# Patient Record
Sex: Male | Born: 1955 | Race: White | Hispanic: No | Marital: Married | Smoking: Former smoker
Health system: Southern US, Community
[De-identification: ages and names within clinical notes are randomized; demographics above are authoritative.]

## PROBLEM LIST (undated history)

## (undated) DIAGNOSIS — M1990 Unspecified osteoarthritis, unspecified site: Secondary | ICD-10-CM

## (undated) DIAGNOSIS — M109 Gout, unspecified: Secondary | ICD-10-CM

## (undated) HISTORY — PX: VASECTOMY: SHX75

---

## 2000-01-11 ENCOUNTER — Ambulatory Visit (HOSPITAL_COMMUNITY): Admission: RE | Admit: 2000-01-11 | Discharge: 2000-01-11 | Payer: Self-pay | Admitting: Gastroenterology

## 2000-01-26 ENCOUNTER — Encounter: Admission: RE | Admit: 2000-01-26 | Discharge: 2000-01-26 | Payer: Self-pay | Admitting: Gastroenterology

## 2000-01-26 ENCOUNTER — Encounter: Payer: Self-pay | Admitting: Gastroenterology

## 2009-05-03 HISTORY — PX: ANKLE SURGERY: SHX546

## 2012-12-09 HISTORY — PX: COLONOSCOPY: SHX174

## 2015-03-21 HISTORY — PX: KNEE ARTHROSCOPY: SUR90

## 2016-03-20 HISTORY — PX: KNEE ARTHROSCOPY: SUR90

## 2017-03-20 HISTORY — PX: COLONOSCOPY: SHX174

## 2017-11-14 ENCOUNTER — Encounter: Payer: Self-pay | Admitting: Gastroenterology

## 2018-04-26 HISTORY — PX: UMBILICAL HERNIA REPAIR: SHX196

## 2019-02-18 ENCOUNTER — Encounter: Payer: Self-pay | Admitting: Orthopaedic Surgery

## 2019-02-18 ENCOUNTER — Ambulatory Visit (INDEPENDENT_AMBULATORY_CARE_PROVIDER_SITE_OTHER): Payer: No Typology Code available for payment source | Admitting: Orthopaedic Surgery

## 2019-02-18 ENCOUNTER — Other Ambulatory Visit: Payer: Self-pay

## 2019-02-18 DIAGNOSIS — M1712 Unilateral primary osteoarthritis, left knee: Secondary | ICD-10-CM | POA: Diagnosis not present

## 2019-02-18 NOTE — Progress Notes (Signed)
Office Visit Note   Patient: Patrick Bright           Date of Birth: 1955/08/01           MRN: [id number] Visit Date: 02/18/2019              Requested by: No referring provider defined for this encounter. PCP: Patient, No Pcp Per   Assessment & Plan: Visit Diagnoses:  1. Unilateral primary osteoarthritis, left knee     Plan: Patient states he is ready proceed with left total knee arthroplasty.  Anti-inflammatories, knee sleeves, intra-articular injections with cortisone as well as Visco supplements have been ineffective.  He has had progressive changes and now has bone-on-bone changes.  Plan total knee arthroplasty with spinal anesthesia, Exparel and Marcaine postoperatively.  We discussed home therapy for couple weeks and then outpatient therapy.  He has a [place] address.  Procedure discussed questions were elicited and answered I talked with his wife by phone due to Covid restrictions she cannot be in the room.  He understands request to proceed.  Follow-Up Instructions: No follow-ups on file.   Orders:  No orders of the defined types were placed in this encounter.  No orders of the defined types were placed in this encounter.     Procedures: No procedures performed   Clinical Data: No additional findings.   Subjective: Chief Complaint  Patient presents with  . Left Knee - Pain    HPI 63 year old male here with chronic left knee pain authorized for referral from the [place] for surgical treatment.  He has had previous cortisone injections anti-inflammatories currently on diclofenac.  Previous arthroscopy right knee 3 years ago left knee 2 years ago by Dr. Junious Bright in [place].  He has had continued pain and intermittent swelling in his knee pain that wakes him up at night and has had Visco injections as well as cortisone injections without relief.  CD is brought with patient today which shows tricompartmental degenerative changes bone-on-bone changes medial compartment  flattening of the femoral condyle marginal osteophytes without marginal erosion.  Subchondral sclerosis and cyst formation is noted.  Review of Systems patient is an Scientist, research (life sciences).  Positive history of gout controlled on allopurinol 200 mg daily.  He takes does well at night to sleep and currently on Voltaren for his knee osteoarthritis left greater than right knee.  Umbilical hernia repair 08/19/7033 doing well.  Patient former smoker.  Negative for fever chills negative for M,I CVA, negative GI GU review of systems.   Objective: Vital Signs: BP 139/85   Pulse 82   Ht 5\' 11"  (1.803 m)   Wt 225 lb (102.1 kg)   BMI 31.38 kg/m   Physical Exam Constitutional:      Appearance: He is well-developed.  HENT:     Head: Normocephalic and atraumatic.  Eyes:     Pupils: Pupils are equal, round, and reactive to light.  Neck:     Thyroid: No thyromegaly.     Trachea: No tracheal deviation.  Cardiovascular:     Rate and Rhythm: Normal rate.  Pulmonary:     Effort: Pulmonary effort is normal.     Breath sounds: No wheezing.  Abdominal:     General: Bowel sounds are normal.     Palpations: Abdomen is soft.  Skin:    General: Skin is warm and dry.     Capillary Refill: Capillary refill takes less than 2 seconds.  Neurological:     Mental Status: He is  alert and oriented to person, place, and time.  Psychiatric:        Behavior: Behavior normal.        Thought Content: Thought content normal.        Judgment: Judgment normal.     Ortho Exam left knee has crepitus with range of motion mild varus deformity palpable medial osteophytes that are tender.  Pes bursa is normal no palpable Baker's cyst he flexes 110 degrees.  He is amatory with a left knee limp.  Negative logroll to the hips.  Distal pulses are 2+.  Specialty Comments:  No specialty comments available.  Imaging: X-rays brought by disc obtained at the [facility] shows moderate to severe left knee osteoarthritis  tricompartmental with bone-on-bone changes medial compartment.  See description above.  PMFS History: Patient Active Problem List   Diagnosis Date Noted  . Unilateral primary osteoarthritis, left knee 02/18/2019   No past medical history on file.  Family History  Problem Relation Age of Onset  . Cancer Father     Past Surgical History:  Procedure Laterality Date  . KNEE ARTHROSCOPY Right 2017  . KNEE ARTHROSCOPY Left 2018  . UMBILICAL HERNIA REPAIR  04/26/2018   Social History   Occupational History  . Not on file  Tobacco Use  . Smoking status: Former Games developer  . Smokeless tobacco: Never Used  . Tobacco comment: quit 10/2018  Substance and Sexual Activity  . Alcohol use: Yes    Comment: occasional  . Drug use: Not on file  . Sexual activity: Not on file

## 2019-02-18 NOTE — H&P (Signed)
TOTAL KNEE ADMISSION H&P  Patient is being admitted for left total knee arthroplasty.  Subjective:  Chief Complaint:left knee pain.  HPI: Patrick Bright, 63 y.o. male, has a history of pain and functional disability in the left knee due to arthritis and has failed non-surgical conservative treatments for greater than 12 weeks to includeNSAID's and/or analgesics, corticosteriod injections, viscosupplementation injections, use of assistive devices and activity modification.  Onset of symptoms was gradual, starting 10 years ago with gradually worsening course since that time.  Patient currently rates pain in the left knee(s) at 10 out of 10 with activity. Patient has night pain, worsening of pain with activity and weight bearing, pain that interferes with activities of daily living, pain with passive range of motion, crepitus and joint swelling.  Patient has evidence of subchondral sclerosis, periarticular osteophytes and joint space narrowing by imaging studies.  There is no active infection.  There are no active problems to display for this patient.  No past medical history on file.  Past Surgical History:  Procedure Laterality Date  . KNEE ARTHROSCOPY Right 2017  . KNEE ARTHROSCOPY Left 2018  . UMBILICAL HERNIA REPAIR  04/26/2018    No current facility-administered medications for this encounter.    Current Outpatient Medications  Medication Sig Dispense Refill Last Dose  . allopurinol (ZYLOPRIM) 100 MG tablet Take 100 mg by mouth 2 (two) times daily.   Taking  . diclofenac (VOLTAREN) 75 MG EC tablet Take 75 mg by mouth daily.   Taking  . Multiple Vitamins-Minerals (ONE-A-DAY 50 PLUS PO) Take by mouth.   Taking  . Omega-3 Fatty Acids (FISH OIL PO) Take 1,400 mg by mouth daily.   Taking  . traZODone (DESYREL) 50 MG tablet Take 50 mg by mouth at bedtime.   Taking   No Known Allergies  Social History   Tobacco Use  . Smoking status: Former Games developer  . Smokeless tobacco: Never Used  .  Tobacco comment: quit 10/2018  Substance Use Topics  . Alcohol use: Yes    Comment: occasional    Family History  Problem Relation Age of Onset  . Cancer Father      Review of Systems  Constitutional: Negative.   HENT: Negative.   Cardiovascular: Negative.   Gastrointestinal: Negative.   Musculoskeletal: Positive for joint pain.  Neurological: Negative.   Psychiatric/Behavioral: Negative.     Objective:  Physical Exam  Constitutional: He is oriented to person, place, and time. He appears well-developed and well-nourished. No distress.  HENT:  Head: Normocephalic and atraumatic.  Eyes: Pupils are equal, round, and reactive to light. EOM are normal.  Neck: Normal range of motion.  Cardiovascular: Normal rate and normal heart sounds.  Respiratory: Effort normal and breath sounds normal. No respiratory distress. He has no wheezes.  GI: Soft. Bowel sounds are normal. He exhibits no distension. There is no abdominal tenderness.  Musculoskeletal:        General: Tenderness present.  Neurological: He is alert and oriented to person, place, and time.  Skin: Skin is warm and dry.  Psychiatric: He has a normal mood and affect.    Vital signs in last 24 hours: Pulse Rate:  [82] 82 (12/01 1326) BP: (139)/(85) 139/85 (12/01 1326) Weight:  [102.1 kg] 102.1 kg (12/01 1326)  Labs:   Estimated body mass index is 31.38 kg/m as calculated from the following:   Height as of 02/18/19: 5\' 11"  (1.803 m).   Weight as of 02/18/19: 102.1 kg.   Imaging Review  Plain radiographs demonstrate moderate degenerative joint disease of the left knee(s). The overall alignment ismild varus. The bone quality appears to be good for age and reported activity level.      Assessment/Plan:  End stage arthritis, left knee   The patient history, physical examination, clinical judgment of the provider and imaging studies are consistent with end stage degenerative joint disease of the left knee(s) and  total knee arthroplasty is deemed medically necessary. The treatment options including medical management, injection therapy arthroscopy and arthroplasty were discussed at length. The risks and benefits of total knee arthroplasty were presented and reviewed. The risks due to aseptic loosening, infection, stiffness, patella tracking problems, thromboembolic complications and other imponderables were discussed. The patient acknowledged the explanation, agreed to proceed with the plan and consent was signed. Patient is being admitted for inpatient treatment for surgery, pain control, PT, OT, prophylactic antibiotics, VTE prophylaxis, progressive ambulation and ADL's and discharge planning. The patient is planning to be discharged home with home health services    Anticipated LOS equal to or greater than 2 midnights due to - Age 58 and older with one or more of the following:  - Obesity  - Expected need for hospital services (PT, OT, Nursing) required for safe  discharge  - Anticipated need for postoperative skilled nursing care or inpatient rehab  - Active co-morbidities: None OR   - Unanticipated findings during/Post Surgery: None  - Patient is a high risk of re-admission due to: None

## 2019-02-19 ENCOUNTER — Telehealth: Payer: Self-pay | Admitting: Orthopaedic Surgery

## 2019-02-19 ENCOUNTER — Other Ambulatory Visit: Payer: Self-pay

## 2019-02-19 NOTE — Telephone Encounter (Signed)
faxed

## 2019-02-19 NOTE — Telephone Encounter (Signed)
Received call from [place] from [facility] needing office visit notes faxed to her for DOS 02/18/2019   The fax# is [phone]   The ph# is [phone]

## 2019-03-03 NOTE — Pre-Procedure Instructions (Signed)
Patrick Bright  03/03/2019      [facility] - [place], [place] - [address] [place] [address] Phone: [phone] Fax: [phone]  [facility] [address] [place] - [address] [facility] [place] [address] Phone: [phone] Fax: [phone]    Your procedure is scheduled on March 07, 2019.  Report to [facility] Entrance "A" at 8:25 AM.  Call this number if you have problems the morning of surgery:  [phone]  Call [phone] if you have any questions prior to your surgery date Monday-Friday 8am-4pm    Remember:  Do not eat after midnight.   You may drink clear liquids until 7:25 AM.  Clear liquids allowed are:                    Water, Juice (non-citric and without pulp), Clear Tea, Black Coffee only and Gatorade- DO NOT ADD MILK PRODUCTS OR SUGAR  Please complete your PRE-SURGERY ENSURE that was provided to you by 7:25 AM the morning of surgery.  Please, if able, drink it in one setting. DO NOT SIP.    Take these medicines the morning of surgery with A SIP OF WATER Allopurinol (Zyloprim)   Beginning now, STOP taking any Diclofenac (Voltaren), Aspirin (unless otherwise instructed by your surgeon), Aleve, Naproxen, Ibuprofen, Motrin, Advil, Goody's, BC's, all herbal medications, fish oil, and all vitamins  Day of Surgery:   Do not wear jewelry  Do not wear lotions, powders, or colognes, or deodorant.  Men may shave face and neck.  Do not bring valuables to the hospital.  [facility] is not responsible for any belongings or valuables.  Contacts, dentures or bridgework may not be worn into surgery.  Leave your suitcase in the car.  After surgery it may be brought to your room.  For patients admitted to the hospital, discharge time will be determined by your treatment team.  Patients discharged the day of surgery will not be allowed to drive home.   [facility]- Preparing For Surgery  Before surgery, you can play an important role. Because skin is not sterile, your skin needs to be as free of germs as possible. You can reduce the number of germs on your skin by washing with CHG (chlorahexidine gluconate) Soap before surgery.  CHG is an antiseptic cleaner which kills germs and bonds with the skin to continue killing germs even after washing.    Oral Hygiene is also important to reduce your risk of infection.  Remember - BRUSH YOUR TEETH THE MORNING OF SURGERY WITH YOUR REGULAR TOOTHPASTE  Please do not use if you have an allergy to CHG or antibacterial soaps. If your skin becomes reddened/irritated stop using the CHG.  Do not shave (including legs and underarms) for at least 48 hours prior to first CHG shower. It is OK to shave your face.  Please follow these instructions carefully.   1. Shower the NIGHT BEFORE SURGERY and the MORNING OF SURGERY with CHG.   2. If you chose to wash your hair, wash your hair first as usual with your normal shampoo.  3. After you shampoo, rinse your hair and body thoroughly to remove the shampoo.  4. Use CHG as you would any other liquid soap. You can apply CHG directly to the skin and wash gently with a scrungie or a clean washcloth.   5. Apply the CHG Soap to your body ONLY FROM  THE NECK DOWN.  Do not use on open wounds or open sores. Avoid contact with your eyes, ears, mouth and genitals (private parts). Wash Face and genitals (private parts)  with your normal soap.  6. Wash thoroughly, paying special attention to the area where your surgery will be performed.  7. Thoroughly rinse your body with warm water from the neck down.  8. DO NOT shower/wash with your normal soap after using and rinsing off the CHG Soap.  9. Pat yourself dry with a CLEAN TOWEL.  10. Wear CLEAN PAJAMAS to bed the night before surgery, wear comfortable clothes the morning of surgery  11. Place CLEAN SHEETS on your bed the night of  your first shower and DO NOT SLEEP WITH PETS.  Day of Surgery: Shower with CHG as instructed above Do not apply any deodorants/lotions.  Please wear clean clothes to the hospital/surgery center.   Remember to brush your teeth WITH YOUR REGULAR TOOTHPASTE.  Please read over the following fact sheets that you were given.

## 2019-03-04 ENCOUNTER — Encounter (HOSPITAL_COMMUNITY)
Admission: RE | Admit: 2019-03-04 | Discharge: 2019-03-04 | Disposition: A | Discharge status: Home / Self Care | Payer: No Typology Code available for payment source | Source: Ambulatory Visit | Attending: Orthopaedic Surgery | Admitting: Orthopaedic Surgery

## 2019-03-04 ENCOUNTER — Other Ambulatory Visit (HOSPITAL_COMMUNITY)
Admission: RE | Admit: 2019-03-04 | Discharge: 2019-03-04 | Disposition: A | Discharge status: Home / Self Care | Payer: No Typology Code available for payment source | Source: Ambulatory Visit | Attending: Orthopaedic Surgery | Admitting: Orthopaedic Surgery

## 2019-03-04 ENCOUNTER — Ambulatory Visit (HOSPITAL_COMMUNITY)
Admission: RE | Admit: 2019-03-04 | Discharge: 2019-03-04 | Disposition: A | Discharge status: Home / Self Care | Payer: No Typology Code available for payment source | Source: Ambulatory Visit | Attending: Surgery | Admitting: Surgery

## 2019-03-04 ENCOUNTER — Other Ambulatory Visit: Payer: Self-pay

## 2019-03-04 ENCOUNTER — Encounter (HOSPITAL_COMMUNITY): Payer: Self-pay

## 2019-03-04 DIAGNOSIS — Z01818 Encounter for other preprocedural examination: Secondary | ICD-10-CM

## 2019-03-04 DIAGNOSIS — M1712 Unilateral primary osteoarthritis, left knee: Secondary | ICD-10-CM | POA: Insufficient documentation

## 2019-03-04 DIAGNOSIS — Z20828 Contact with and (suspected) exposure to other viral communicable diseases: Secondary | ICD-10-CM | POA: Insufficient documentation

## 2019-03-04 HISTORY — DX: Gout, unspecified: M10.9

## 2019-03-04 HISTORY — DX: Unspecified osteoarthritis, unspecified site: M19.90

## 2019-03-04 LAB — CBC
HCT: 47 % (ref 39.0–52.0)
Hemoglobin: 15.7 g/dL (ref 13.0–17.0)
MCH: 32.1 pg (ref 26.0–34.0)
MCHC: 33.4 g/dL (ref 30.0–36.0)
MCV: 96.1 fL (ref 80.0–100.0)
Platelets: 271 10*3/uL (ref 150–400)
RBC: 4.89 MIL/uL (ref 4.22–5.81)
RDW: 12.8 % (ref 11.5–15.5)
WBC: 7.9 10*3/uL (ref 4.0–10.5)
nRBC: 0 % (ref 0.0–0.2)

## 2019-03-04 LAB — SURGICAL PCR SCREEN
MRSA, PCR: NEGATIVE
Staphylococcus aureus: NEGATIVE

## 2019-03-04 LAB — URINALYSIS, ROUTINE W REFLEX MICROSCOPIC
Bilirubin Urine: NEGATIVE
Glucose, UA: NEGATIVE mg/dL
Hgb urine dipstick: NEGATIVE
Ketones, ur: NEGATIVE mg/dL
Leukocytes,Ua: NEGATIVE
Nitrite: NEGATIVE
Protein, ur: NEGATIVE mg/dL
Specific Gravity, Urine: 1.024 (ref 1.005–1.030)
pH: 5 (ref 5.0–8.0)

## 2019-03-04 LAB — COMPREHENSIVE METABOLIC PANEL
ALT: 62 U/L — ABNORMAL HIGH (ref 0–44)
AST: 45 U/L — ABNORMAL HIGH (ref 15–41)
Albumin: 4 g/dL (ref 3.5–5.0)
Alkaline Phosphatase: 57 U/L (ref 38–126)
Anion gap: 17 — ABNORMAL HIGH (ref 5–15)
BUN: 12 mg/dL (ref 8–23)
CO2: 19 mmol/L — ABNORMAL LOW (ref 22–32)
Calcium: 9.6 mg/dL (ref 8.9–10.3)
Chloride: 103 mmol/L (ref 98–111)
Creatinine, Ser: 0.98 mg/dL (ref 0.61–1.24)
GFR calc Af Amer: >60 mL/min (ref >60)
GFR calc non Af Amer: >60 mL/min (ref >60)
Glucose, Bld: 95 mg/dL (ref 70–99)
Potassium: 4.2 mmol/L (ref 3.5–5.1)
Sodium: 139 mmol/L (ref 135–145)
Total Bilirubin: 0.7 mg/dL (ref 0.3–1.2)
Total Protein: 7.5 g/dL (ref 6.5–8.1)

## 2019-03-04 NOTE — Progress Notes (Signed)
[facility] Cardiologist - denies  PPM/ICD - N/A Device Orders -N/A  Rep Notified - N/A  Chest x-ray - 03/04/19 EKG - 03/04/19 Stress Test - denies ECHO - denies Cardiac Cath - denies  Sleep Study - denies CPAP - denies  Blood Thinner Instructions:N/A Aspirin Instructions:N/A  ERAS Protcol -Yes PRE-SURGERY Ensure or G2- Ensure provided.   COVID TEST- 03/04/19.    Anesthesia review: No  Patient denies shortness of breath, fever, cough and chest pain at PAT appointment   All instructions explained to the patient, with a verbal understanding of the material. Patient agrees to go over the instructions while at home for a better understanding. Patient also instructed to self quarantine after being tested for COVID-19. The opportunity to ask questions was provided.   Coronavirus Screening  Have you experienced the following symptoms:  Cough yes/no: No Fever (>100.27F)  yes/no: No Runny nose yes/no: No Sore throat yes/no: No Difficulty breathing/shortness of breath  yes/no: No  Have you or a family member traveled in the last 14 days and where? yes/no: No   If the patient indicates "YES" to the above questions, their PAT will be rescheduled to limit the exposure to others and, the surgeon will be notified. THE PATIENT WILL NEED TO BE ASYMPTOMATIC FOR 14 DAYS.   If the patient is not experiencing any of these symptoms, the PAT nurse will instruct them to NOT bring anyone with them to their appointment since they may have these symptoms or traveled as well.   Please remind your patients and families that hospital visitation restrictions are in effect and the importance of the restrictions.

## 2019-03-05 LAB — NOVEL CORONAVIRUS, NAA (HOSP ORDER, SEND-OUT TO REF LAB; TAT 18-24 HRS): SARS-CoV-2, NAA: NOT DETECTED

## 2019-03-06 MED ORDER — TRANEXAMIC ACID-NACL 1000-0.7 MG/100ML-% IV SOLN
1000.0000 mg | INTRAVENOUS | Status: AC
  Administered 2019-03-07: 1000 mg via INTRAVENOUS
  Filled 2019-03-06: qty 100

## 2019-03-06 MED ORDER — BUPIVACAINE LIPOSOME 1.3 % IJ SUSP
20.0000 mL | Freq: Once | INTRAMUSCULAR | Status: AC
  Administered 2019-03-07: 20 mL
  Filled 2019-03-06: qty 20

## 2019-03-07 ENCOUNTER — Inpatient Hospital Stay (HOSPITAL_COMMUNITY): Payer: No Typology Code available for payment source | Admitting: Anesthesiology

## 2019-03-07 ENCOUNTER — Encounter (HOSPITAL_COMMUNITY)
Admission: RE | Disposition: A | Discharge status: Home / Self Care | Payer: Self-pay | Source: Home / Self Care | Attending: Orthopaedic Surgery

## 2019-03-07 ENCOUNTER — Inpatient Hospital Stay (HOSPITAL_COMMUNITY)
Admission: RE | Admit: 2019-03-07 | Discharge: 2019-03-08 | DRG: 470 | Disposition: A | Discharge status: Home Health | Payer: No Typology Code available for payment source | Attending: Orthopaedic Surgery | Admitting: Orthopaedic Surgery

## 2019-03-07 ENCOUNTER — Observation Stay (HOSPITAL_COMMUNITY): Payer: No Typology Code available for payment source

## 2019-03-07 ENCOUNTER — Other Ambulatory Visit: Payer: Self-pay

## 2019-03-07 ENCOUNTER — Encounter (HOSPITAL_COMMUNITY): Payer: Self-pay | Admitting: Orthopaedic Surgery

## 2019-03-07 DIAGNOSIS — Z791 Long term (current) use of non-steroidal anti-inflammatories (NSAID): Secondary | ICD-10-CM | POA: Diagnosis not present

## 2019-03-07 DIAGNOSIS — Z79899 Other long term (current) drug therapy: Secondary | ICD-10-CM | POA: Diagnosis not present

## 2019-03-07 DIAGNOSIS — E669 Obesity, unspecified: Secondary | ICD-10-CM | POA: Diagnosis present

## 2019-03-07 DIAGNOSIS — Z6831 Body mass index (BMI) 31.0-31.9, adult: Secondary | ICD-10-CM | POA: Diagnosis not present

## 2019-03-07 DIAGNOSIS — Z87891 Personal history of nicotine dependence: Secondary | ICD-10-CM

## 2019-03-07 DIAGNOSIS — M1712 Unilateral primary osteoarthritis, left knee: Secondary | ICD-10-CM | POA: Diagnosis present

## 2019-03-07 DIAGNOSIS — Z20828 Contact with and (suspected) exposure to other viral communicable diseases: Secondary | ICD-10-CM | POA: Diagnosis present

## 2019-03-07 DIAGNOSIS — Z09 Encounter for follow-up examination after completed treatment for conditions other than malignant neoplasm: Secondary | ICD-10-CM

## 2019-03-07 HISTORY — PX: TOTAL KNEE ARTHROPLASTY: SHX125

## 2019-03-07 SURGERY — ARTHROPLASTY, KNEE, TOTAL
Anesthesia: Spinal | Site: Knee | Laterality: Left

## 2019-03-07 MED ORDER — DOCUSATE SODIUM 100 MG PO CAPS
100.0000 mg | ORAL_CAPSULE | Freq: Two times a day (BID) | ORAL | Status: DC
  Administered 2019-03-07 – 2019-03-08 (×3): 100 mg via ORAL
  Filled 2019-03-07 (×3): qty 1

## 2019-03-07 MED ORDER — FENTANYL CITRATE (PF) 100 MCG/2ML IJ SOLN
INTRAMUSCULAR | Status: AC
  Filled 2019-03-07: qty 2

## 2019-03-07 MED ORDER — OXYCODONE HCL 5 MG/5ML PO SOLN: 5.0000 mg | Freq: Once | ORAL | Status: AC | PRN

## 2019-03-07 MED ORDER — ONDANSETRON HCL 4 MG/2ML IJ SOLN: 4.0000 mg | Freq: Four times a day (QID) | INTRAMUSCULAR | Status: DC | PRN

## 2019-03-07 MED ORDER — FENTANYL CITRATE (PF) 250 MCG/5ML IJ SOLN
INTRAMUSCULAR | Status: AC
  Filled 2019-03-07: qty 5

## 2019-03-07 MED ORDER — METOCLOPRAMIDE HCL 5 MG/ML IJ SOLN: 5.0000 mg | Freq: Three times a day (TID) | INTRAMUSCULAR | Status: DC | PRN

## 2019-03-07 MED ORDER — TRAZODONE HCL 50 MG PO TABS: 50.0000 mg | ORAL_TABLET | Freq: Every evening | ORAL | Status: DC | PRN

## 2019-03-07 MED ORDER — PHENOL 1.4 % MT LIQD: 1.0000 | OROMUCOSAL | Status: DC | PRN

## 2019-03-07 MED ORDER — PROPOFOL 500 MG/50ML IV EMUL
INTRAVENOUS | Status: DC | PRN
  Administered 2019-03-07 (×2): 100 ug/kg/min via INTRAVENOUS

## 2019-03-07 MED ORDER — OXYCODONE HCL 5 MG PO TABS
5.0000 mg | ORAL_TABLET | Freq: Once | ORAL | Status: AC | PRN
  Administered 2019-03-07: 5 mg via ORAL

## 2019-03-07 MED ORDER — POLYETHYLENE GLYCOL 3350 17 G PO PACK: 17.0000 g | PACK | Freq: Every day | ORAL | Status: DC | PRN

## 2019-03-07 MED ORDER — MIDAZOLAM HCL 2 MG/2ML IJ SOLN: 2.0000 mg | Freq: Once | INTRAMUSCULAR | Status: AC

## 2019-03-07 MED ORDER — LACTATED RINGERS IV SOLN: INTRAVENOUS | Status: DC

## 2019-03-07 MED ORDER — PHENYLEPHRINE HCL-NACL 10-0.9 MG/250ML-% IV SOLN
INTRAVENOUS | Status: DC | PRN
  Administered 2019-03-07: 30 ug/min via INTRAVENOUS

## 2019-03-07 MED ORDER — CEFAZOLIN SODIUM-DEXTROSE 2-4 GM/100ML-% IV SOLN
2.0000 g | INTRAVENOUS | Status: AC
  Administered 2019-03-07: 10:00:00 2 g via INTRAVENOUS

## 2019-03-07 MED ORDER — FENTANYL CITRATE (PF) 100 MCG/2ML IJ SOLN
INTRAMUSCULAR | Status: AC
  Administered 2019-03-07: 50 ug via INTRAVENOUS
  Filled 2019-03-07: qty 2

## 2019-03-07 MED ORDER — FENTANYL CITRATE (PF) 100 MCG/2ML IJ SOLN
25.0000 ug | INTRAMUSCULAR | Status: DC | PRN
  Administered 2019-03-07: 50 ug via INTRAVENOUS

## 2019-03-07 MED ORDER — MENTHOL 3 MG MT LOZG: 1.0000 | LOZENGE | OROMUCOSAL | Status: DC | PRN

## 2019-03-07 MED ORDER — OXYCODONE HCL 5 MG PO TABS
5.0000 mg | ORAL_TABLET | ORAL | Status: DC | PRN
  Administered 2019-03-07 – 2019-03-08 (×5): 10 mg via ORAL
  Filled 2019-03-07 (×5): qty 2

## 2019-03-07 MED ORDER — PROPOFOL 10 MG/ML IV BOLUS
INTRAVENOUS | Status: AC
  Filled 2019-03-07: qty 40

## 2019-03-07 MED ORDER — METHOCARBAMOL 500 MG PO TABS: 500.0000 mg | ORAL_TABLET | Freq: Four times a day (QID) | ORAL | 0 refills | Status: DC | PRN

## 2019-03-07 MED ORDER — OXYCODONE HCL 5 MG PO TABS
ORAL_TABLET | ORAL | Status: AC
  Filled 2019-03-07: qty 1

## 2019-03-07 MED ORDER — MIDAZOLAM HCL 2 MG/2ML IJ SOLN
INTRAMUSCULAR | Status: AC
  Administered 2019-03-07: 2 mg via INTRAVENOUS
  Filled 2019-03-07: qty 2

## 2019-03-07 MED ORDER — ROPIVACAINE HCL 7.5 MG/ML IJ SOLN
INTRAMUSCULAR | Status: DC | PRN
  Administered 2019-03-07: 20 mL via PERINEURAL

## 2019-03-07 MED ORDER — ACETAMINOPHEN 325 MG PO TABS: 325.0000 mg | ORAL_TABLET | Freq: Four times a day (QID) | ORAL | Status: DC | PRN

## 2019-03-07 MED ORDER — CHLORHEXIDINE GLUCONATE 4 % EX LIQD: 60.0000 mL | Freq: Once | CUTANEOUS | Status: DC

## 2019-03-07 MED ORDER — SODIUM CHLORIDE 0.9 % IV SOLN: INTRAVENOUS | Status: DC

## 2019-03-07 MED ORDER — ALLOPURINOL 100 MG PO TABS
200.0000 mg | ORAL_TABLET | Freq: Every day | ORAL | Status: DC
  Administered 2019-03-07 – 2019-03-08 (×2): 200 mg via ORAL
  Filled 2019-03-07 (×2): qty 2

## 2019-03-07 MED ORDER — ASPIRIN EC 325 MG PO TBEC
325.0000 mg | DELAYED_RELEASE_TABLET | Freq: Every day | ORAL | Status: DC
  Administered 2019-03-08: 325 mg via ORAL
  Filled 2019-03-07: qty 1

## 2019-03-07 MED ORDER — OXYCODONE-ACETAMINOPHEN 7.5-325 MG PO TABS: 1.0000 | ORAL_TABLET | ORAL | 0 refills | Status: DC | PRN

## 2019-03-07 MED ORDER — METHOCARBAMOL 500 MG PO TABS
500.0000 mg | ORAL_TABLET | Freq: Four times a day (QID) | ORAL | Status: DC | PRN
  Administered 2019-03-07 – 2019-03-08 (×4): 500 mg via ORAL
  Filled 2019-03-07 (×4): qty 1

## 2019-03-07 MED ORDER — PROPOFOL 10 MG/ML IV BOLUS
INTRAVENOUS | Status: DC | PRN
  Administered 2019-03-07: 40 mg via INTRAVENOUS

## 2019-03-07 MED ORDER — SODIUM CHLORIDE 0.9 % IR SOLN
Status: DC | PRN
  Administered 2019-03-07: 3000 mL

## 2019-03-07 MED ORDER — METOCLOPRAMIDE HCL 5 MG PO TABS: 5.0000 mg | ORAL_TABLET | Freq: Three times a day (TID) | ORAL | Status: DC | PRN

## 2019-03-07 MED ORDER — 0.9 % SODIUM CHLORIDE (POUR BTL) OPTIME
TOPICAL | Status: DC | PRN
  Administered 2019-03-07: 1000 mL

## 2019-03-07 MED ORDER — NICOTINE POLACRILEX 2 MG MT GUM
2.0000 mg | CHEWING_GUM | OROMUCOSAL | Status: DC | PRN
  Filled 2019-03-07: qty 1

## 2019-03-07 MED ORDER — MIDAZOLAM HCL 2 MG/2ML IJ SOLN
INTRAMUSCULAR | Status: AC
  Filled 2019-03-07: qty 2

## 2019-03-07 MED ORDER — ASPIRIN EC 325 MG PO TBEC: 325.0000 mg | DELAYED_RELEASE_TABLET | Freq: Every day | ORAL | 0 refills | Status: DC

## 2019-03-07 MED ORDER — METHOCARBAMOL 500 MG PO TABS
ORAL_TABLET | ORAL | Status: AC
  Filled 2019-03-07: qty 1

## 2019-03-07 MED ORDER — BUPIVACAINE HCL (PF) 0.5 % IJ SOLN
INTRAMUSCULAR | Status: AC
  Filled 2019-03-07: qty 30

## 2019-03-07 MED ORDER — CEFAZOLIN SODIUM-DEXTROSE 2-4 GM/100ML-% IV SOLN
INTRAVENOUS | Status: AC
  Filled 2019-03-07: qty 100

## 2019-03-07 MED ORDER — HYDROMORPHONE HCL 1 MG/ML IJ SOLN
0.5000 mg | INTRAMUSCULAR | Status: DC | PRN
  Administered 2019-03-07: 1 mg via INTRAVENOUS
  Filled 2019-03-07: qty 1

## 2019-03-07 MED ORDER — ONDANSETRON HCL 4 MG PO TABS: 4.0000 mg | ORAL_TABLET | Freq: Four times a day (QID) | ORAL | Status: DC | PRN

## 2019-03-07 MED ORDER — BUPIVACAINE IN DEXTROSE 0.75-8.25 % IT SOLN
INTRATHECAL | Status: DC | PRN
  Administered 2019-03-07: 1.8 mL via INTRATHECAL

## 2019-03-07 MED ORDER — METHOCARBAMOL 1000 MG/10ML IJ SOLN
500.0000 mg | Freq: Four times a day (QID) | INTRAVENOUS | Status: DC | PRN
  Filled 2019-03-07: qty 5

## 2019-03-07 MED ORDER — BUPIVACAINE HCL (PF) 0.5 % IJ SOLN
INTRAMUSCULAR | Status: DC | PRN
  Administered 2019-03-07: 20 mL

## 2019-03-07 MED ORDER — PROMETHAZINE HCL 25 MG/ML IJ SOLN: 6.2500 mg | INTRAMUSCULAR | Status: DC | PRN

## 2019-03-07 MED ORDER — FENTANYL CITRATE (PF) 100 MCG/2ML IJ SOLN: 50.0000 ug | Freq: Once | INTRAMUSCULAR | Status: AC

## 2019-03-07 SURGICAL SUPPLY — 82 items
APL SKNCLS STERI-STRIP NONHPOA (GAUZE/BANDAGES/DRESSINGS) ×1
ATTUNE MED DOME PAT 41 KNEE (Knees) ×1 IMPLANT
ATTUNE MED DOME PAT 41MM KNEE (Knees) ×1 IMPLANT
ATTUNE PS FEM LT SZ 6 CEM KNEE (Femur) ×2 IMPLANT
ATTUNE PSRP INSR SZ6 5 KNEE (Insert) ×1 IMPLANT
ATTUNE PSRP INSR SZ6 5MM KNEE (Insert) ×1 IMPLANT
BANDAGE ESMARK 6X9 LF (GAUZE/BANDAGES/DRESSINGS) ×1 IMPLANT
BASE TIBIA ATTUNE KNEE SYS SZ6 (Knees) IMPLANT
BENZOIN TINCTURE PRP APPL 2/3 (GAUZE/BANDAGES/DRESSINGS) ×3 IMPLANT
BLADE SAGITTAL 25.0X1.19X90 (BLADE) ×2 IMPLANT
BLADE SAGITTAL 25.0X1.19X90MM (BLADE) ×1
BLADE SAW SGTL 13X75X1.27 (BLADE) ×3 IMPLANT
BNDG CMPR 9X6 STRL LF SNTH (GAUZE/BANDAGES/DRESSINGS) ×1
BNDG CMPR MED 10X6 ELC LF (GAUZE/BANDAGES/DRESSINGS) ×1
BNDG ELASTIC 4X5.8 VLCR STR LF (GAUZE/BANDAGES/DRESSINGS) ×3 IMPLANT
BNDG ELASTIC 6X10 VLCR STRL LF (GAUZE/BANDAGES/DRESSINGS) ×3 IMPLANT
BNDG ESMARK 6X9 LF (GAUZE/BANDAGES/DRESSINGS) ×3
BOWL SMART MIX CTS (DISPOSABLE) ×3 IMPLANT
BSPLAT TIB 6 CMNT ROT PLAT STR (Knees) ×1 IMPLANT
CEMENT HV SMART SET (Cement) ×6 IMPLANT
CLOSURE WOUND 1/2 X4 (GAUZE/BANDAGES/DRESSINGS) ×2
COVER SURGICAL LIGHT HANDLE (MISCELLANEOUS) ×3 IMPLANT
COVER WAND RF STERILE (DRAPES) ×3 IMPLANT
CUFF TOURN SGL QUICK 34 (TOURNIQUET CUFF) ×3
CUFF TOURN SGL QUICK 42 (TOURNIQUET CUFF) ×2 IMPLANT
CUFF TRNQT CYL 34X4.125X (TOURNIQUET CUFF) ×1 IMPLANT
DRAPE ORTHO SPLIT 77X108 STRL (DRAPES) ×6
DRAPE SURG ORHT 6 SPLT 77X108 (DRAPES) ×2 IMPLANT
DRAPE U-SHAPE 47X51 STRL (DRAPES) ×3 IMPLANT
DRSG PAD ABDOMINAL 8X10 ST (GAUZE/BANDAGES/DRESSINGS) ×3 IMPLANT
DURAPREP 26ML APPLICATOR (WOUND CARE) ×6 IMPLANT
ELECT REM PT RETURN 9FT ADLT (ELECTROSURGICAL) ×3
ELECTRODE REM PT RTRN 9FT ADLT (ELECTROSURGICAL) ×1 IMPLANT
EVACUATOR 1/8 PVC DRAIN (DRAIN) IMPLANT
FACESHIELD WRAPAROUND (MASK) ×6 IMPLANT
FACESHIELD WRAPAROUND OR TEAM (MASK) ×2 IMPLANT
GAUZE SPONGE 4X4 12PLY STRL (GAUZE/BANDAGES/DRESSINGS) ×3 IMPLANT
GAUZE XEROFORM 5X9 LF (GAUZE/BANDAGES/DRESSINGS) ×3 IMPLANT
GLOVE BIOGEL PI IND STRL 8 (GLOVE) ×2 IMPLANT
GLOVE BIOGEL PI INDICATOR 8 (GLOVE) ×4
GLOVE ORTHO TXT STRL SZ7.5 (GLOVE) ×6 IMPLANT
GOWN STRL REUS W/ TWL LRG LVL3 (GOWN DISPOSABLE) ×1 IMPLANT
GOWN STRL REUS W/ TWL XL LVL3 (GOWN DISPOSABLE) ×1 IMPLANT
GOWN STRL REUS W/TWL 2XL LVL3 (GOWN DISPOSABLE) ×3 IMPLANT
GOWN STRL REUS W/TWL LRG LVL3 (GOWN DISPOSABLE) ×3
GOWN STRL REUS W/TWL XL LVL3 (GOWN DISPOSABLE) ×3
HANDPIECE INTERPULSE COAX TIP (DISPOSABLE) ×3
IMMOBILIZER KNEE 22 UNIV (SOFTGOODS) ×3 IMPLANT
KIT BASIN OR (CUSTOM PROCEDURE TRAY) ×3 IMPLANT
KIT TURNOVER KIT B (KITS) ×3 IMPLANT
MANIFOLD NEPTUNE II (INSTRUMENTS) ×3 IMPLANT
MARKER SKIN DUAL TIP RULER LAB (MISCELLANEOUS) ×3 IMPLANT
NDL 18GX1X1/2 (RX/OR ONLY) (NEEDLE) ×1 IMPLANT
NDL HYPO 25GX1X1/2 BEV (NEEDLE) ×1 IMPLANT
NEEDLE 18GX1X1/2 (RX/OR ONLY) (NEEDLE) ×3 IMPLANT
NEEDLE HYPO 25GX1X1/2 BEV (NEEDLE) ×3 IMPLANT
NS IRRIG 1000ML POUR BTL (IV SOLUTION) ×3 IMPLANT
PACK TOTAL JOINT (CUSTOM PROCEDURE TRAY) ×3 IMPLANT
PAD ABD 8X10 STRL (GAUZE/BANDAGES/DRESSINGS) ×2 IMPLANT
PAD ARMBOARD 7.5X6 YLW CONV (MISCELLANEOUS) ×6 IMPLANT
PAD CAST 4YDX4 CTTN HI CHSV (CAST SUPPLIES) ×1 IMPLANT
PADDING CAST COTTON 4X4 STRL (CAST SUPPLIES) ×3
PADDING CAST COTTON 6X4 STRL (CAST SUPPLIES) ×3 IMPLANT
PIN STEINMAN FIXATION KNEE (PIN) ×2 IMPLANT
SET HNDPC FAN SPRY TIP SCT (DISPOSABLE) ×1 IMPLANT
STAPLER VISISTAT 35W (STAPLE) IMPLANT
STRIP CLOSURE SKIN 1/2X4 (GAUZE/BANDAGES/DRESSINGS) ×4 IMPLANT
SUCTION FRAZIER HANDLE 10FR (MISCELLANEOUS) ×2
SUCTION TUBE FRAZIER 10FR DISP (MISCELLANEOUS) ×1 IMPLANT
SUT VIC AB 0 CT1 27 (SUTURE) ×3
SUT VIC AB 0 CT1 27XBRD ANBCTR (SUTURE) ×1 IMPLANT
SUT VIC AB 1 CTX 36 (SUTURE) ×6
SUT VIC AB 1 CTX36XBRD ANBCTR (SUTURE) ×2 IMPLANT
SUT VIC AB 2-0 CT1 27 (SUTURE) ×6
SUT VIC AB 2-0 CT1 TAPERPNT 27 (SUTURE) ×2 IMPLANT
SUT VIC AB 3-0 X1 27 (SUTURE) ×3 IMPLANT
SYR 50ML LL SCALE MARK (SYRINGE) ×3 IMPLANT
SYR CONTROL 10ML LL (SYRINGE) ×3 IMPLANT
TIBIA ATTUNE KNEE SYS BASE SZ6 (Knees) ×3 IMPLANT
TOWEL GREEN STERILE (TOWEL DISPOSABLE) ×3 IMPLANT
TOWEL GREEN STERILE FF (TOWEL DISPOSABLE) ×3 IMPLANT
TRAY CATH 16FR W/PLASTIC CATH (SET/KITS/TRAYS/PACK) IMPLANT

## 2019-03-07 NOTE — Anesthesia Procedure Notes (Signed)
Anesthesia Regional Block: Adductor canal block   Pre-Anesthetic Checklist: ,, timeout performed, Correct Patient, Correct Site, Correct Laterality, Correct Procedure, Correct Position, site marked, Risks and benefits discussed,  Surgical consent,  Pre-op evaluation,  At surgeon's request and post-op pain management  Laterality: Left  Prep: chloraprep       Needles:  Injection technique: Single-shot  Needle Type: Echogenic Needle     Needle Length: 10cm  Needle Gauge: 21     Additional Needles:   Narrative:  Start time: 03/07/2019 9:16 AM End time: 03/07/2019 9:20 AM Injection made incrementally with aspirations every 5 mL.  Performed by: Personally  Anesthesiologist: Audry Pili, MD  Additional Notes: No pain on injection. No increased resistance to injection. Injection made in 5cc increments. Good needle visualization. Patient tolerated the procedure well.

## 2019-03-07 NOTE — Op Note (Signed)
Preop diagnosis: Left knee primary osteoarthritis  Postop diagnosis: Same  Procedure: Left total knee arthroplasty.  Surgeon: Rodell Perna, MD  Assistant: Benjiman Core, PA-C medically necessary and present for the entire procedure  Anesthesia spinal plus local Exparel and Marcaine 10 preoperative abductor block.  Tourniquet time 52 minutes x 350.  Implants:Depuy Attune size 6 femur size 6 tibia 5 mm rotating platform 41 mm 3 peg patellar dome patellar component.  Smart set cement.  Procedure: After induction of anesthesia proximal thigh tourniquet standard prepping draping sterile skin marker Betadine Steri-Drape impervious stockinette Coban and all been applied timeout procedure was completed Ancef was given prophylactically.  IV TXA.  Leg was wrapped in Esmarch tourniquet inflated midline incision was made after timeout procedure patella was everted and initially 10 was taken off the distal femur but it appeared that more bone needed to be resected.  9 mm taken off the tibia and 5 mm spacer block would not quite fit in.  We took an additional 2 mm off the tibia and then came back and took 2 more millimeters off the femur which allowed full extension with 5 mm block and good collateral balance.  Chamfer cuts made on the femur after sizing so a #6.  Box cut was made tibia sized also size 6 meniscal remnants were removed.  There was tricompartmental degenerative changes eburnated hard bone particularly on the femur side with there was no cartilage left polish subchondral bone.  ACL PCL were resected and after keel preparation of the tibia pulse lavage.  10 mm resected on the tibia drilling of the lug nuts in the trial femur and also patella.  Pack to mixing of the cement cementing of the tibia followed by femur placement of the permanent poly-.  Spurs have been removed posteriorly off the femur with three-quarter curved osteotome with some large spurs marginal osteophytes mostly on the femur more so than  the tibia.  Cement was hard at 15 minutes tourniquet deflated and Marcaine Exparel was injected while the cement was setting up into the capsule subtenons tissue.  Standard closure of the deep layer after tourniquet deflation hemostasis was obtained 2 oh and subtenons tissue skin staple closure postop dressing and transferred to recovery room after dressing and knee immobilizer.

## 2019-03-07 NOTE — Anesthesia Procedure Notes (Signed)
Procedure Name: MAC Date/Time: 03/07/2019 10:06 AM Performed by: Leonor Liv, CRNA Pre-anesthesia Checklist: Patient identified, Emergency Drugs available, Suction available, Patient being monitored and Timeout performed Patient Re-evaluated:Patient Re-evaluated prior to induction Oxygen Delivery Method: Simple face mask Placement Confirmation: positive ETCO2 Dental Injury: Teeth and Oropharynx as per pre-operative assessment

## 2019-03-07 NOTE — Anesthesia Procedure Notes (Signed)
Spinal  Patient location during procedure: OR Start time: 03/07/2019 10:09 AM End time: 03/07/2019 10:12 AM Staffing Performed: anesthesiologist  Anesthesiologist: Audry Pili, MD Preanesthetic Checklist Completed: patient identified, IV checked, risks and benefits discussed, surgical consent, monitors and equipment checked, pre-op evaluation and timeout performed Spinal Block Patient position: sitting Prep: DuraPrep Patient monitoring: heart rate, cardiac monitor, continuous pulse ox and blood pressure Approach: midline Location: L3-4 Injection technique: single-shot Needle Needle type: Pencan  Needle gauge: 24 G Additional Notes Consent was obtained prior to the procedure with all questions answered and concerns addressed. Risks including, but not limited to, bleeding, infection, nerve damage, paralysis, failed block, inadequate analgesia, allergic reaction, high spinal, itching, and headache were discussed and the patient wished to proceed. Functioning IV was confirmed and monitors were applied. Sterile prep and drape, including hand hygiene, mask, and sterile gloves were used. The patient was positioned and the spine was prepped. The skin was anesthetized with lidocaine. Free flow of clear CSF was obtained prior to injecting local anesthetic into the CSF. The spinal needle aspirated freely following injection. The needle was carefully withdrawn. The patient tolerated the procedure well.   Renold Don, MD

## 2019-03-07 NOTE — Interval H&P Note (Signed)
History and Physical Interval Note:  03/07/2019 9:16 AM  Darene Lamer  has presented today for surgery, with the diagnosis of left knee osteoarthritis.  The various methods of treatment have been discussed with the patient and family. After consideration of risks, benefits and other options for treatment, the patient has consented to  Procedure(s): LEFT TOTAL KNEE ARTHROPLASTY CEMENTED (Left) as a surgical intervention.  The patient's history has been reviewed, patient examined, no change in status, stable for surgery.  I have reviewed the patient's chart and labs.  Questions were answered to the patient's satisfaction.     [name]

## 2019-03-07 NOTE — Progress Notes (Signed)
Orthopedic Tech Progress Note Patient Details:  Patrick Bright 1955/11/16 [id number]  CPM Left Knee CPM Left Knee: On Left Knee Flexion (Degrees): 90 Left Knee Extension (Degrees): 0 Additional Comments: Trapeze bar and foot roll  Post Interventions Patient Tolerated: Well Instructions Provided: Care of device  [name] 03/07/2019, 2:31 PM

## 2019-03-07 NOTE — Discharge Instructions (Addendum)
INSTRUCTIONS AFTER JOINT REPLACEMENT   o Remove items at home which could result in a fall. This includes throw rugs or furniture in walking pathways o ICE to the affected joint every three hours while awake for 30 minutes at a time, for at least the first 3-5 days, and then as needed for pain and swelling.  Continue to use ice for pain and swelling. You may notice swelling that will progress down to the foot and ankle.  This is normal after surgery.  Elevate your leg when you are not up walking on it.   o Continue to use the breathing machine you got in the hospital (incentive spirometer) which will help keep your temperature down.  It is common for your temperature to cycle up and down following surgery, especially at night when you are not up moving around and exerting yourself.  The breathing machine keeps your lungs expanded and your temperature down.   DIET:  As you were doing prior to hospitalization, we recommend a well-balanced diet.  Dressing:     You may shower with your dressing on since it is waterproof.  Leave dressing on until seen in office, it is sterile and should not need to be changed.    ACTIVITY  o Increase activity slowly as tolerated, but follow the weight bearing instructions below.   o No driving for 6 weeks or until further direction given by your physician.  You cannot drive while taking narcotics.  o No lifting or carrying greater than 10 lbs. until further directed by your surgeon. o Avoid periods of inactivity such as sitting longer than an hour when not asleep. This helps prevent blood clots.  o You may return to work once you are authorized by your doctor.     WEIGHT BEARING   Weight bearing as tolerated with assist device (walker, cane, etc) as directed, use it as long as suggested by your surgeon or therapist, typically at least 4-6 weeks.   EXERCISES  Results after joint replacement surgery are often greatly improved when you follow the exercise, range  of motion and muscle strengthening exercises prescribed by your doctor. Safety measures are also important to protect the joint from further injury. Any time any of these exercises cause you to have increased pain or swelling, decrease what you are doing until you are comfortable again and then slowly increase them. If you have problems or questions, call your caregiver or physical therapist for advice.   Rehabilitation is important following a joint replacement. After just a few days of immobilization, the muscles of the leg can become weakened and shrink (atrophy).  These exercises are designed to build up the tone and strength of the thigh and leg muscles and to improve motion. Often times heat used for twenty to thirty minutes before working out will loosen up your tissues and help with improving the range of motion but do not use heat for the first two weeks following surgery (sometimes heat can increase post-operative swelling).   These exercises can be done on a training (exercise) mat, on the floor, on a table or on a bed. Use whatever works the best and is most comfortable for you.    Use music or television while you are exercising so that the exercises are a pleasant break in your day. This will make your life better with the exercises acting as a break in your routine that you can look forward to.   Perform all exercises about fifteen times,  three times per day or as directed.  You should exercise both the operative leg and the other leg as well.  Exercises include:   . Quad Sets - Tighten up the muscle on the front of the thigh (Quad) and hold for 5-10 seconds.   . Straight Leg Raises - With your knee straight (if you were given a brace, keep it on), lift the leg to 60 degrees, hold for 3 seconds, and slowly lower the leg.  Perform this exercise against resistance later as your leg gets stronger.  . Leg Slides: Lying on your back, slowly slide your foot toward your buttocks, bending your knee  up off the floor (only go as far as is comfortable). Then slowly slide your foot back down until your leg is flat on the floor again.  Lawanna Kobus. Angel Wings: Lying on your back spread your legs to the side as far apart as you can without causing discomfort.  . Hamstring Strength:  Lying on your back, push your heel against the floor with your leg straight by tightening up the muscles of your buttocks.  Repeat, but this time bend your knee to a comfortable angle, and push your heel against the floor.  You may put a pillow under the heel to make it more comfortable if necessary.   A rehabilitation program following joint replacement surgery can speed recovery and prevent re-injury in the future due to weakened muscles. Contact your doctor or a physical therapist for more information on knee rehabilitation.    CONSTIPATION  Constipation is defined medically as fewer than three stools per week and severe constipation as less than one stool per week.  Even if you have a regular bowel pattern at home, your normal regimen is likely to be disrupted due to multiple reasons following surgery.  Combination of anesthesia, postoperative narcotics, change in appetite and fluid intake all can affect your bowels.   YOU MUST use at least one of the following options; they are listed in order of increasing strength to get the job done.  They are all available over the counter, and you may need to use some, POSSIBLY even all of these options:    Drink plenty of fluids (prune juice may be helpful) and high fiber foods Colace 100 mg by mouth twice a day  Senokot for constipation as directed and as needed Dulcolax (bisacodyl), take with full glass of water  Miralax (polyethylene glycol) once or twice a day as needed.  If you have tried all these things and are unable to have a bowel movement in the first 3-4 days after surgery call either your surgeon or your primary doctor.    If you experience loose stools or diarrhea, hold  the medications until you stool forms back up.  If your symptoms do not get better within 1 week or if they get worse, check with your doctor.  If you experience "the worst abdominal pain ever" or develop nausea or vomiting, please contact the office immediately for further recommendations for treatment.   ITCHING:  If you experience itching with your medications, try taking only a single pain pill, or even half a pain pill at a time.  You can also use Benadryl over the counter for itching or also to help with sleep.   TED HOSE STOCKINGS:  Use stockings on both legs until for at least 2 weeks or as directed by physician office. They may be removed at night for sleeping.  MEDICATIONS:  See your  medication summary on the "After Visit Summary" that nursing will review with you.  You may have some home medications which will be placed on hold until you complete the course of blood thinner medication.  It is important for you to complete the blood thinner medication as prescribed.  PRECAUTIONS:  If you experience chest pain or shortness of breath - call 911 immediately for transfer to the hospital emergency department.   If you develop a fever greater that 101 F, purulent drainage from wound, increased redness or drainage from wound, foul odor from the wound/dressing, or calf pain - CONTACT YOUR SURGEON.                                                   FOLLOW-UP APPOINTMENTS:  If you do not already have a post-op appointment, please call the office for an appointment to be seen by your surgeon.  Guidelines for how soon to be seen are listed in your "After Visit Summary", but are typically between 1-4 weeks after surgery.  OTHER INSTRUCTIONS:   Knee Replacement:  Do not place pillow under knee, focus on keeping the knee straight while resting. CPM instructions: 0-90 degrees, 2 hours in the morning, 2 hours in the afternoon, and 2 hours in the evening. Place foam block, curve side up under heel at all  times except when in CPM or when walking.  DO NOT modify, tear, cut, or change the foam block in any way.  MAKE SURE YOU:  . Understand these instructions.  . Get help right away if you are not doing well or get worse.    Thank you for letting us be a part of your medical care team.  It is a privilege we respect greatly.  We hope these instructions will help you stay on track for a fast and full recovery!

## 2019-03-07 NOTE — Anesthesia Postprocedure Evaluation (Signed)
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)     Patient location during evaluation: PACU Anesthesia Type: Spinal Level of consciousness: awake and alert Pain management: pain level controlled Vital Signs Assessment: post-procedure vital signs reviewed and stable Respiratory status: spontaneous breathing and respiratory function stable Cardiovascular status: blood pressure returned to baseline and stable Postop Assessment: spinal receding and no apparent nausea or vomiting Anesthetic complications: no    Last Vitals:  Vitals:   03/07/19 1345 03/07/19 1415  BP: 117/79   Pulse: 70 78  Resp: 18 18  Temp:    SpO2: 100% 99%    Last Pain:  Vitals:   03/07/19 1345  TempSrc:   PainSc: 2                  Audry Pili

## 2019-03-07 NOTE — Evaluation (Signed)
Physical Therapy Evaluation Patient Details Name: Patrick Bright MRN: [id number] DOB: 1955/05/02 Today's Date: 03/07/2019   History of Present Illness  Patient is a 63 y/o male admitted following L TKA.  Clinical Impression  Patient presents with decreased mobility due to pain, decreased ROM and decreased strength.  He will benefit from skilled PT in the acute setting to allow return home with family support and PT follow up as noted below.     Follow Up Recommendations Follow surgeon's recommendation for DC plan and follow-up therapies    Equipment Recommendations  None recommended by PT    Recommendations for Other Services       Precautions / Restrictions Precautions Precautions: Knee Required Braces or Orthoses: Knee Immobilizer - Left Restrictions Other Position/Activity Restrictions: WBAT      Mobility  Bed Mobility Overal bed mobility: Modified Independent                Transfers Overall transfer level: Needs assistance Equipment used: Rolling walker (2 wheeled) Transfers: Sit to/from Stand Sit to Stand: Min guard         General transfer comment: assist for balance  Ambulation/Gait Ambulation/Gait assistance: Supervision Gait Distance (Feet): 90 Feet Assistive device: Rolling walker (2 wheeled) Gait Pattern/deviations: Step-through pattern;Antalgic     General Gait Details: good step through pattern and able to flex and extend knee during gait cycle, but some antalgia noted  Stairs            Wheelchair Mobility    Modified Rankin (Stroke Patients Only)       Balance Overall balance assessment: Mild deficits observed, not formally tested                                           Pertinent Vitals/Pain Pain Assessment: 0-10 Pain Score: 3  Pain Location: L knee Pain Descriptors / Indicators: Throbbing Pain Intervention(s): Monitored during session;Repositioned    Home Living Family/patient expects to be  discharged to:: Private residence Living Arrangements: Spouse/significant other Available Help at Discharge: Family Type of Home: House Home Access: Stairs to enter   [employer] of Steps: 2 Home Layout: Two level;Able to live on main level with bedroom/bathroom Home Equipment: Bedside commode;Walker - 2 wheels;Crutches      Prior Function Level of Independence: Independent               Hand Dominance        Extremity/Trunk Assessment   Upper Extremity Assessment Upper Extremity Assessment: Overall WFL for tasks assessed    Lower Extremity Assessment Lower Extremity Assessment: LLE deficits/detail LLE Deficits / Details: AAROM knee flexion about 45, extension about -15, some quad lag with SLR       Communication   Communication: No difficulties  Cognition Arousal/Alertness: Awake/alert Behavior During Therapy: WFL for tasks assessed/performed Overall Cognitive Status: Within Functional Limits for tasks assessed                                        General Comments      Exercises Total Joint Exercises Ankle Circles/Pumps: AROM;Both;10 reps;Supine Quad Sets: AROM;Both;10 reps;Supine Short Arc Quad: AROM;5 reps;Supine;Left Heel Slides: AROM;5 reps;Left;Supine Hip ABduction/ADduction: AROM;5 reps;Supine;Left Straight Leg Raises: AROM;5 reps;Supine;Left   Assessment/Plan    PT Assessment Patient needs continued PT services  PT Problem List Decreased strength;Decreased mobility;Decreased range of motion;Decreased knowledge of use of DME;Pain       PT Treatment Interventions DME instruction;Stair training;Therapeutic activities;Functional mobility training;Therapeutic exercise;Gait training;Patient/family education    PT Goals (Current goals can be found in the Care Plan section)  Acute Rehab PT Goals Patient Stated Goal: to return to golf PT Goal Formulation: With patient Time For Goal Achievement: 03/14/19 Potential to  Achieve Goals: Good    Frequency 7X/week   Barriers to discharge        Co-evaluation               AM-PAC PT "6 Clicks" Mobility  Outcome Measure Help needed turning from your back to your side while in a flat bed without using bedrails?: None Help needed moving from lying on your back to sitting on the side of a flat bed without using bedrails?: None Help needed moving to and from a bed to a chair (including a wheelchair)?: A Little Help needed standing up from a chair using your arms (e.g., wheelchair or bedside chair)?: A Little Help needed to walk in hospital room?: A Little Help needed climbing 3-5 steps with a railing? : A Little 6 Click Score: 20    End of Session   Activity Tolerance: Patient tolerated treatment well Patient left: in chair;with call bell/phone within reach Nurse Communication: Mobility status PT Visit Diagnosis: Difficulty in walking, not elsewhere classified (R26.2);Pain Pain - Right/Left: Left Pain - part of body: Knee    Time: 1726-1755 PT Time Calculation (min) (ACUTE ONLY): 29 min   Charges:   PT Evaluation $PT Eval Low Complexity: 1 Low PT Treatments $Gait Training: 8-22 mins        Magda Kiel, [place] [facility] [phone] 03/07/2019   [name] 03/07/2019, 6:43 PM

## 2019-03-07 NOTE — Plan of Care (Signed)

## 2019-03-07 NOTE — Anesthesia Preprocedure Evaluation (Addendum)
Anesthesia Evaluation  Patient identified by MRN, date of birth, ID band Patient awake    Reviewed: Allergy & Precautions, NPO status , Patient's Chart, lab work & pertinent test results  History of Anesthesia Complications Negative for: history of anesthetic complications  Airway Mallampati: II  TM Distance: >3 FB Neck ROM: Full    Dental  (+) Dental Advisory Given, Teeth Intact   Pulmonary former smoker,    Pulmonary exam normal        Cardiovascular negative cardio ROS Normal cardiovascular exam     Neuro/Psych negative neurological ROS  negative psych ROS   GI/Hepatic negative GI ROS, Neg liver ROS,   Endo/Other   Obesity   Renal/GU negative Renal ROS     Musculoskeletal  (+) Arthritis ,  Gout    Abdominal   Peds  Hematology negative hematology ROS (+)  Plt 271k   Anesthesia Other Findings Covid neg 12/15   Reproductive/Obstetrics                            Anesthesia Physical Anesthesia Plan  ASA: II  Anesthesia Plan: Spinal   Post-op Pain Management:  Regional for Post-op pain   Induction:   PONV Risk Score and Plan: 1 and Treatment may vary due to age or medical condition and Propofol infusion  Airway Management Planned: Natural Airway and Simple Face Mask  Additional Equipment: None  Intra-op Plan:   Post-operative Plan:   Informed Consent: I have reviewed the patients History and Physical, chart, labs and discussed the procedure including the risks, benefits and alternatives for the proposed anesthesia with the patient or authorized representative who has indicated his/her understanding and acceptance.       Plan Discussed with: CRNA and Anesthesiologist  Anesthesia Plan Comments: (Labs reviewed, platelets acceptable. Discussed risks and benefits of spinal, including spinal/epidural hematoma, infection, failed block, and PDPH. Patient expressed  understanding and wished to proceed. )       Anesthesia Quick Evaluation

## 2019-03-07 NOTE — Transfer of Care (Signed)
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  Last Pain:  Vitals:   03/07/19 1224  TempSrc:   PainSc: 0-No pain      Patients Stated Pain Goal: 2 (85/92/92 [id number])  Complications: No apparent anesthesia complications

## 2019-03-08 LAB — CBC
HCT: 34.1 % — ABNORMAL LOW (ref 39.0–52.0)
Hemoglobin: 11.7 g/dL — ABNORMAL LOW (ref 13.0–17.0)
MCH: 32.3 pg (ref 26.0–34.0)
MCHC: 34.3 g/dL (ref 30.0–36.0)
MCV: 94.2 fL (ref 80.0–100.0)
Platelets: 229 10*3/uL (ref 150–400)
RBC: 3.62 MIL/uL — ABNORMAL LOW (ref 4.22–5.81)
RDW: 12.8 % (ref 11.5–15.5)
WBC: 12.5 10*3/uL — ABNORMAL HIGH (ref 4.0–10.5)
nRBC: 0 % (ref 0.0–0.2)

## 2019-03-08 LAB — BASIC METABOLIC PANEL
Anion gap: 8 (ref 5–15)
BUN: 11 mg/dL (ref 8–23)
CO2: 25 mmol/L (ref 22–32)
Calcium: 8.4 mg/dL — ABNORMAL LOW (ref 8.9–10.3)
Chloride: 101 mmol/L (ref 98–111)
Creatinine, Ser: 0.91 mg/dL (ref 0.61–1.24)
GFR calc Af Amer: >60 mL/min (ref >60)
GFR calc non Af Amer: >60 mL/min (ref >60)
Glucose, Bld: 137 mg/dL — ABNORMAL HIGH (ref 70–99)
Potassium: 4 mmol/L (ref 3.5–5.1)
Sodium: 134 mmol/L — ABNORMAL LOW (ref 135–145)

## 2019-03-08 NOTE — Progress Notes (Signed)
Physical Therapy Treatment Patient Details Name: Patrick Bright MRN: [id number] DOB: [birth year] Today's Date: 03/08/2019    History of Present Illness Patient is a 63 y/o male admitted following L TKA.    PT Comments    Pt making steady progress with mobility. He tolerated stair training this session without difficulties. Wife was present and practiced stairs with pt and PT provided them with handout as well. Pt would continue to benefit from skilled physical therapy services at this time while admitted and after d/c to address the below listed limitations in order to improve overall safety and independence with functional mobility.   Follow Up Recommendations  Home health PT;Supervision - Intermittent     Equipment Recommendations  None recommended by PT    Recommendations for Other Services       Precautions / Restrictions Precautions Precautions: Knee Restrictions Weight Bearing Restrictions: Yes LLE Weight Bearing: Weight bearing as tolerated    Mobility  Bed Mobility Overal bed mobility: Needs Assistance Bed Mobility: Supine to Sit     Supine to sit: Min assist     General bed mobility comments: min A for movement of L LE off of bed  Transfers Overall transfer level: Needs assistance Equipment used: Rolling walker (2 wheeled) Transfers: Sit to/from Stand Sit to Stand: Min guard         General transfer comment: min guard for safety, good technique utilized  Ambulation/Gait Ambulation/Gait assistance: Scientist, forensic (Feet): 100 Feet Assistive device: Rolling walker (2 wheeled) Gait Pattern/deviations: Step-through pattern;Antalgic Gait velocity: decreased   General Gait Details: pt progressing from step-to to step-through pattern with moderately antalgic gait; no LOB or need for physical assistance   Stairs Stairs: Yes Stairs assistance: Min assist Stair Management: No rails;Step to pattern;Backwards;With walker Number of Stairs: 2(x2  trials) General stair comments: PT demonstrated and instructed pt in ascending/descending stairs with RW as pt does not have railing at home. Pt's wife present throughout and practiced with pt as well.   Wheelchair Mobility    Modified Rankin (Stroke Patients Only)       Balance Overall balance assessment: Needs assistance Sitting-balance support: Feet supported Sitting balance-Leahy Scale: Good     Standing balance support: Bilateral upper extremity supported;Single extremity supported Standing balance-Leahy Scale: Poor                              Cognition Arousal/Alertness: Awake/alert Behavior During Therapy: WFL for tasks assessed/performed Overall Cognitive Status: Within Functional Limits for tasks assessed                                        Exercises      General Comments        Pertinent Vitals/Pain Pain Assessment: 0-10 Pain Score: 8  Pain Location: L knee Pain Descriptors / Indicators: Throbbing;Sore Pain Intervention(s): Monitored during session;Repositioned    Home Living                      Prior Function            PT Goals (current goals can now be found in the care plan section) Acute Rehab PT Goals PT Goal Formulation: With patient Time For Goal Achievement: 03/14/19 Potential to Achieve Goals: Good Progress towards PT goals: Progressing toward goals    Frequency  7X/week      PT Plan Current plan remains appropriate    Co-evaluation              AM-PAC PT "6 Clicks" Mobility   Outcome Measure  Help needed turning from your back to your side while in a flat bed without using bedrails?: None Help needed moving from lying on your back to sitting on the side of a flat bed without using bedrails?: None Help needed moving to and from a bed to a chair (including a wheelchair)?: A Little Help needed standing up from a chair using your arms (e.g., wheelchair or bedside chair)?: A  Little Help needed to walk in hospital room?: A Little Help needed climbing 3-5 steps with a railing? : A Little 6 Click Score: 20    End of Session Equipment Utilized During Treatment: Gait belt Activity Tolerance: Patient tolerated treatment well Patient left: in chair;with call bell/phone within reach;with family/visitor present Nurse Communication: Mobility status PT Visit Diagnosis: Difficulty in walking, not elsewhere classified (R26.2);Pain Pain - Right/Left: Left Pain - part of body: Knee     Time: 1250-1310 PT Time Calculation (min) (ACUTE ONLY): 20 min  Charges:  $Gait Training: 8-22 mins                     Arletta Bale, DPT  Acute Rehabilitation Services Pager [phone] Office [phone]     Alessandra Bevels Smriti Barkow 03/08/2019, 2:43 PM

## 2019-03-08 NOTE — Progress Notes (Signed)
Physical Therapy Treatment Patient Details Name: Patrick Bright MRN: [id number] DOB: 1955/11/23 Today's Date: 03/08/2019    History of Present Illness Patient is a 63 y/o male admitted following L TKA.    PT Comments    Pt making steady progress with functional mobility. Will plan for stair training at next session prior to d/c home with family today.  Pt would continue to benefit from skilled physical therapy services at this time while admitted and after d/c to address the below listed limitations in order to improve overall safety and independence with functional mobility.    Follow Up Recommendations  Home health PT;Supervision - Intermittent     Equipment Recommendations  None recommended by PT    Recommendations for Other Services       Precautions / Restrictions Precautions Precautions: Knee Restrictions Weight Bearing Restrictions: Yes LLE Weight Bearing: Weight bearing as tolerated    Mobility  Bed Mobility Overal bed mobility: Needs Assistance Bed Mobility: Supine to Sit     Supine to sit: Min assist     General bed mobility comments: min A for movement of L LE off of bed  Transfers Overall transfer level: Needs assistance Equipment used: Rolling walker (2 wheeled) Transfers: Sit to/from Stand Sit to Stand: Min guard         General transfer comment: min guard for safety, good technique utilized  Ambulation/Gait Ambulation/Gait assistance: Scientist, forensic (Feet): 100 Feet Assistive device: Rolling walker (2 wheeled) Gait Pattern/deviations: Step-through pattern;Antalgic Gait velocity: decreased   General Gait Details: pt progressing from step-to to step-through pattern with moderately antalgic gait; no LOB or need for physical assistance   Stairs             Wheelchair Mobility    Modified Rankin (Stroke Patients Only)       Balance Overall balance assessment: Needs assistance Sitting-balance support: Feet  supported Sitting balance-Leahy Scale: Good     Standing balance support: Bilateral upper extremity supported;Single extremity supported Standing balance-Leahy Scale: Poor                              Cognition Arousal/Alertness: Awake/alert Behavior During Therapy: WFL for tasks assessed/performed Overall Cognitive Status: Within Functional Limits for tasks assessed                                        Exercises Total Joint Exercises Quad Sets: AROM;Strengthening;Left;10 reps;Seated Heel Slides: AAROM;Left;10 reps;Seated Hip ABduction/ADduction: AAROM;Left;10 reps;Seated    General Comments        Pertinent Vitals/Pain Pain Assessment: 0-10 Pain Score: 6  Pain Location: L knee Pain Descriptors / Indicators: Throbbing;Sore Pain Intervention(s): Monitored during session;Repositioned    Home Living                      Prior Function            PT Goals (current goals can now be found in the care plan section) Acute Rehab PT Goals PT Goal Formulation: With patient Time For Goal Achievement: 03/14/19 Potential to Achieve Goals: Good Progress towards PT goals: Progressing toward goals    Frequency    7X/week      PT Plan Current plan remains appropriate    Co-evaluation              AM-PAC PT "6  Clicks" Mobility   Outcome Measure  Help needed turning from your back to your side while in a flat bed without using bedrails?: None Help needed moving from lying on your back to sitting on the side of a flat bed without using bedrails?: None Help needed moving to and from a bed to a chair (including a wheelchair)?: A Little Help needed standing up from a chair using your arms (e.g., wheelchair or bedside chair)?: A Little Help needed to walk in hospital room?: A Little Help needed climbing 3-5 steps with a railing? : A Little 6 Click Score: 20    End of Session Equipment Utilized During Treatment: Gait  belt Activity Tolerance: Patient tolerated treatment well Patient left: in chair;with call bell/phone within reach Nurse Communication: Mobility status PT Visit Diagnosis: Difficulty in walking, not elsewhere classified (R26.2);Pain Pain - Right/Left: Left Pain - part of body: Knee     Time: 7829-5621 PT Time Calculation (min) (ACUTE ONLY): 18 min  Charges:  $Gait Training: 8-22 mins                     Arletta Bale, DPT  Acute Rehabilitation Services Pager [phone] Office [phone]     [name] 03/08/2019, 10:50 AM

## 2019-03-08 NOTE — Plan of Care (Signed)

## 2019-03-08 NOTE — Progress Notes (Signed)
   Subjective: 1 Day Post-Op Procedure(s) (LRB): LEFT TOTAL KNEE ARTHROPLASTY CEMENTED (Left) Patient reports pain as mild and moderate.    Objective: Vital signs in last 24 hours: Temp:  [97.4 F (36.3 C)-99.2 F (37.3 C)] 98.7 F (37.1 C) (12/19 0436) Pulse Rate:  [68-109] 95 (12/19 0436) Resp:  [15-24] 17 (12/18 1517) BP: (103-130)/(66-86) 120/77 (12/19 0436) SpO2:  [91 %-100 %] 94 % (12/19 0436)  Intake/Output from previous day: 12/18 0701 - 12/19 0700 In: 3142 [P.O.:480; I.V.:2662] Out: 1750 [Urine:1700; Blood:50] Intake/Output this shift: Total I/O In: 240 [P.O.:240] Out: -   Recent Labs    03/08/19 0318  HGB 11.7*   Recent Labs    03/08/19 0318  WBC 12.5*  RBC 3.62*  HCT 34.1*  PLT 229   Recent Labs    03/08/19 0318  NA 134*  K 4.0  CL 101  CO2 25  BUN 11  CREATININE 0.91  GLUCOSE 137*  CALCIUM 8.4*   No results for input(s): LABPT, INR in the last 72 hours.  Neurologically intact DG Knee 1-2 Views Left  Result Date: 03/07/2019 CLINICAL DATA:  Total knee arthroplasty. EXAM: LEFT KNEE - 1-2 VIEW COMPARISON:  None. FINDINGS: Well seated components of a total knee arthroplasty without complicating features. IMPRESSION: Well seated components of a total knee arthroplasty. Electronically Signed   By: Marijo Sanes M.D.   On: 03/07/2019 14:25    Assessment/Plan: 1 Day Post-Op Procedure(s) (LRB): LEFT TOTAL KNEE ARTHROPLASTY CEMENTED (Left) Up with therapy. Walking in hall today. D/c home after therapy session after lunch. Office one week.   HAVARD RADIGAN 03/08/2019, 8:56 AM

## 2019-03-08 NOTE — Plan of Care (Signed)

## 2019-03-10 ENCOUNTER — Telehealth: Payer: Self-pay | Admitting: Orthopaedic Surgery

## 2019-03-10 ENCOUNTER — Encounter: Payer: Self-pay | Admitting: *Deleted

## 2019-03-10 NOTE — Telephone Encounter (Signed)
Patient's wife called stating that the patient will be out of pain medication Friday.  Patient had surgery on 03/07/19.  She was under the impression that Dr. Lorin Mercy would not fill the pain medication until he is seen by Dr. Lorin Mercy.  CB#[phone].  Thank you.

## 2019-03-10 NOTE — Telephone Encounter (Signed)
Please advise. Patient has follow up appt next week. Wife is concerned he will be out of pain medication and not able to get filled.

## 2019-03-10 NOTE — Telephone Encounter (Signed)
Do you want for me to bring patient in on Dec 23?  He will not be a full week post op. Office is closed Thurs and Friday.

## 2019-03-10 NOTE — Progress Notes (Signed)
Received call that patient was discharged home without HHPT; Chart reviewed, no orders for [facility] ordered entered in Epic. [facility] is [facility] and the [place] must approve all [place] / DME; TCT patient at home; he goes to the [facility]; PCP is Dr Arletha Grippe; his Community Nurse Navigator is Hulen Skains [phone]; SW Glean Hess [phone] ext [phone]  TCT Dr Lorin Mercy office, spoke to [place]; his nurse is busy at this time; message left for her to call in [facility] orders to the [place] for arrangement and contact numbers given. CM is unable to assist with arrangements for HHPT due to inability to enter orders after the patient has discharged home. Patient called and updated.   Mindi Slicker [facility] Advanced Care Supervisor

## 2019-03-10 NOTE — Telephone Encounter (Signed)
I left voicemail requesting return call. 

## 2019-03-10 NOTE — Telephone Encounter (Signed)
thanks

## 2019-03-10 NOTE — Telephone Encounter (Signed)
FYI. I will send fax referral to [facility] for home health PT post total knee rehab.

## 2019-03-10 NOTE — Telephone Encounter (Signed)
Dr. Lorin Mercy has spoken with patient's wife. HHPT was not ordered from the hospital.  Per note from hospital, no orders were entered and they are unable to assist now that the patient has been discharged.  Number and fax given for [facility] to send HHPT order to by hospital staff.

## 2019-03-10 NOTE — Telephone Encounter (Signed)
Patient's wife called. Mr. Heemstra had surgery recently and they have questions regarding after care.   Call back number: [phone]

## 2019-03-10 NOTE — Telephone Encounter (Signed)
ROV one week post op will be fine. Thanks got 7 days pain meds from [place] per [employer] narcotic rules. I already talked with her. thanks

## 2019-03-10 NOTE — Telephone Encounter (Signed)
Olga Coaster at [facility] called. Says Dr. Lorin Mercy wanted PT at home for the patient but there is no orders. She says now that the patient is discharged. You would have to call the [place] and send orders to them for PT at home. Maggie Southerland at [place] [phone] ext [phone]. faxt [phone]

## 2019-03-10 NOTE — Telephone Encounter (Signed)
Either way OK or he can wait until  after xmas Tuesday and I can send more pain meds in on Wednesday of this week. If he waits make sure they call for pain meds Wednesday or you remind me thanks

## 2019-03-11 ENCOUNTER — Telehealth: Payer: Self-pay | Admitting: *Deleted

## 2019-03-11 ENCOUNTER — Other Ambulatory Visit: Payer: Self-pay | Admitting: *Deleted

## 2019-03-11 DIAGNOSIS — M1712 Unilateral primary osteoarthritis, left knee: Secondary | ICD-10-CM

## 2019-03-11 DIAGNOSIS — Z96652 Presence of left artificial knee joint: Secondary | ICD-10-CM

## 2019-03-11 NOTE — Telephone Encounter (Signed)
CM was asked to assist with getting home health therapy set-up for patient, who is S/P left TKA on 03/07/19 with Dr. Lorin Mercy. Patient did go home same day discharge. Apparently hospital CM could not assist for arranging home health as the order was put in just after discharge. CM contacted the [place] and spoke with Kenney Houseman [phone], covering CM, for Berton Lan is out of town this week. Authorization given for OPPT 15 visits and not home health. Discussed with Dr. Lorin Mercy and patient, who feels he could go to OP instead anyway. He requested [facility] in [place]. Therapy location was able to accommodate an appointment for initial eval on Thursday, 03/13/19 at 8:00 am this week. Orders, demographic, OP note, and H&P sent to fax requested. Patient aware of appointment and is very agreeable to starting OPPT.Will continue to assist if needed for further CM needs.

## 2019-03-11 NOTE — Telephone Encounter (Signed)
Sending to you for contact information that Dr. Lorin Mercy wanted me to get to you. I have faxed HHPT rx but have been unable to reach [facility] at this point. Please let me know if you need anything further from me. Thanks for your help.

## 2019-03-11 NOTE — Telephone Encounter (Signed)
I have reached out as well and no answer. Also found the name of a nurse navigator, who is out of office and can't get her coverage to answer. I have spoken to Mrs. Rozeboom, who is also sending a message through the [place] (my chart) system to see who we can contact. I'll keep working on it.

## 2019-03-11 NOTE — Discharge Summary (Signed)
Patient ID: Patrick Bright MRN: [id number] DOB/AGE: 1955/08/06 63 y.o.  Admit date: 03/07/2019 Discharge date: 03/11/2019  Admission Diagnoses:  Active Problems:   Arthritis of left knee   Discharge Diagnoses:  Active Problems:   Arthritis of left knee  status post Procedure(s): LEFT TOTAL KNEE ARTHROPLASTY CEMENTED  Past Medical History:  Diagnosis Date  . Arthritis   . Gout     Surgeries: Procedure(s): LEFT TOTAL KNEE ARTHROPLASTY CEMENTED on 03/07/2019   Consultants:   Discharged Condition: Improved  Hospital Course: Patrick Bright is an 63 y.o. male who was admitted 03/07/2019 for operative treatment of left knee arthritis. Patient failed conservative treatments (please see the history and physical for the specifics) and had severe unremitting pain that affects sleep, daily activities and work/hobbies. After pre-op clearance, the patient was taken to the operating room on 03/07/2019 and underwent  Procedure(s): LEFT TOTAL KNEE ARTHROPLASTY CEMENTED.    Patient was given perioperative antibiotics:  Anti-infectives (From admission, onward)   Start     Dose/Rate Route Frequency Ordered Stop   03/07/19 0830  ceFAZolin (ANCEF) IVPB 2g/100 mL premix     2 g 200 mL/hr over 30 Minutes Intravenous On call to O.R. 03/07/19 0818 03/07/19 1051   03/07/19 0826  ceFAZolin (ANCEF) 2-4 GM/100ML-% IVPB    Note to Pharmacy: Clydene Laming   : cabinet override      03/07/19 0826 03/07/19 1021       Patient was given sequential compression devices and early ambulation to prevent DVT.   Patient benefited maximally from hospital stay and there were no complications. At the time of discharge, the patient was urinating/moving their bowels without difficulty, tolerating a regular diet, pain is controlled with oral pain medications and they have been cleared by PT/OT.   Recent vital signs: No data found.   Recent laboratory studies: No results for input(s): WBC, HGB, HCT, PLT, NA, K, CL,  CO2, BUN, CREATININE, GLUCOSE, INR, CALCIUM in the last 72 hours.  Invalid input(s): PT, 2   Discharge Medications:   Allergies as of 03/08/2019   No Known Allergies     Medication List    STOP taking these medications   diclofenac 75 MG EC tablet Commonly known as: VOLTAREN   FISH OIL PO   ONE-A-DAY 50 PLUS PO     TAKE these medications   allopurinol 100 MG tablet Commonly known as: ZYLOPRIM Take 200 mg by mouth daily.   aspirin EC 325 MG tablet Take 1 tablet (325 mg total) by mouth daily. MUST TAKE AT LEAST 4 WEEKS POSTOP FOR DVT PROPHYLAXIS   methocarbamol 500 MG tablet Commonly known as: Robaxin Take 1 tablet (500 mg total) by mouth every 6 (six) hours as needed for muscle spasms.   nicotine polacrilex 2 MG gum Commonly known as: NICORETTE Take 2 mg by mouth as needed for smoking cessation.   oxyCODONE-acetaminophen 7.5-325 MG tablet Commonly known as: Percocet Take 1 tablet by mouth every 4 (four) hours as needed for severe pain.   traZODone 50 MG tablet Commonly known as: DESYREL Take 50 mg by mouth at bedtime as needed for sleep.       Diagnostic Studies: DG Chest 2 View  Result Date: 03/04/2019 CLINICAL DATA:  Preoperative EXAM: CHEST - 2 VIEW COMPARISON:  None. FINDINGS: The heart size and mediastinal contours are within normal limits. Both lungs are clear. The visualized skeletal structures are unremarkable. IMPRESSION: No acute abnormality of the lungs. Electronically Signed   By: Trinna Post  Laqueta Carina M.D.   On: 03/04/2019 10:25   DG Knee 1-2 Views Left  Result Date: 03/07/2019 CLINICAL DATA:  Total knee arthroplasty. EXAM: LEFT KNEE - 1-2 VIEW COMPARISON:  None. FINDINGS: Well seated components of a total knee arthroplasty without complicating features. IMPRESSION: Well seated components of a total knee arthroplasty. Electronically Signed   By: Marijo Sanes M.D.   On: 03/07/2019 14:25      Follow-up Information    Patrick Killings, MD. Schedule an  appointment as soon as possible for a visit today.   Specialty: Orthopedic Surgery Why: needs return office visit 1 weeks postop Contact information: [address] [place] [address] [phone]           Discharge Plan:  discharge to home  Disposition:     Signed: Benjiman Core  03/11/2019, 3:39 PM

## 2019-03-11 NOTE — Telephone Encounter (Signed)
noted 

## 2019-03-11 NOTE — Telephone Encounter (Signed)
Rx has been faxed for HHPT. I attempted to reach Patrick Bright this morning, however, there is no answer and it rings back to main greeting after 3 rings. Will try again.

## 2019-03-12 ENCOUNTER — Other Ambulatory Visit: Payer: Self-pay | Admitting: Orthopaedic Surgery

## 2019-03-12 MED ORDER — OXYCODONE-ACETAMINOPHEN 5-325 MG PO TABS: 1.0000 | ORAL_TABLET | Freq: Four times a day (QID) | ORAL | 0 refills | Status: DC | PRN

## 2019-03-12 NOTE — Telephone Encounter (Signed)
Order completed

## 2019-03-12 NOTE — Telephone Encounter (Signed)
Patient will be out of pain medication on Friday. Refill request.

## 2019-03-12 NOTE — Progress Notes (Signed)
Pharmacy only gave 7 day supply. Percocet # 40   5/325 sent in.

## 2019-03-18 ENCOUNTER — Ambulatory Visit (INDEPENDENT_AMBULATORY_CARE_PROVIDER_SITE_OTHER): Payer: No Typology Code available for payment source

## 2019-03-18 ENCOUNTER — Ambulatory Visit (INDEPENDENT_AMBULATORY_CARE_PROVIDER_SITE_OTHER): Payer: Non-veteran care | Admitting: Orthopaedic Surgery

## 2019-03-18 ENCOUNTER — Other Ambulatory Visit: Payer: Self-pay

## 2019-03-18 ENCOUNTER — Encounter: Payer: Self-pay | Admitting: Orthopaedic Surgery

## 2019-03-18 VITALS — BP 134/90 | HR 109 | Ht 71.0 in | Wt 225.0 lb

## 2019-03-18 DIAGNOSIS — Z96652 Presence of left artificial knee joint: Secondary | ICD-10-CM

## 2019-03-18 NOTE — Progress Notes (Signed)
   Post-Op Visit Note   Patient: Patrick Bright           Date of Birth: 06/01/1955           MRN: [id number] Visit Date: 03/18/2019 PCP: Patient, No Pcp Per   Assessment & Plan: Continue work on knee rehab return 1 week for staple removal.  X-ray showed good position and alignment.  Chief Complaint:  Chief Complaint  Patient presents with  . Left Knee - Routine Post Op    03/07/2019 Left TKA Cemented   Visit Diagnoses:  1. Status post total left knee replacement     Plan: Post left total knee arthroplasty.  He can do leg lift has about a 10 to 15 degree extension lag.  Still having problems reaching full extension.  He has the bone foam that he can use at home putting his heel and allowing his knee to sag up straight.  Follow-Up Instructions: Return in about 1 week (around 03/25/2019).   Orders:  Orders Placed This Encounter  Procedures  . XR Knee 1-2 Views Left   No orders of the defined types were placed in this encounter.   Imaging: No results found.  PMFS History: Patient Active Problem List   Diagnosis Date Noted  . Arthritis of left knee 03/07/2019  . Unilateral primary osteoarthritis, left knee 02/18/2019   Past Medical History:  Diagnosis Date  . Arthritis   . Gout     Family History  Problem Relation Age of Onset  . Cancer Father     Past Surgical History:  Procedure Laterality Date  . ANKLE SURGERY Left 05/03/2009   tarsal tunnel syndrome   . KNEE ARTHROSCOPY Right 2017  . KNEE ARTHROSCOPY Left 2018  . TOTAL KNEE ARTHROPLASTY Left 03/07/2019  . TOTAL KNEE ARTHROPLASTY Left 03/07/2019   Procedure: LEFT TOTAL KNEE ARTHROPLASTY CEMENTED;  Surgeon: [name], MD;  Location: [facility];  Service: Orthopedics;  Laterality: Left;  . UMBILICAL HERNIA REPAIR  04/26/2018   Social History   Occupational History  . Not on file  Tobacco Use  . Smoking status: Former Research scientist (life sciences)  . Smokeless tobacco: Never Used  . Tobacco comment: quit 10/2018  Substance and  Sexual Activity  . Alcohol use: Yes    Alcohol/week: 2.0 standard drinks    Types: 2 Cans of beer per week    Comment: occasional  . Drug use: Never  . Sexual activity: Not on file

## 2019-03-24 ENCOUNTER — Telehealth: Payer: Self-pay | Admitting: *Deleted

## 2019-03-24 NOTE — Telephone Encounter (Signed)
Patient called me today and states he will be in need of more pain medication by his next visit on Wednesday this week with Dr. Ophelia Charter. He just wanted [place] to be aware that because he is [place], the medication will have to be filled there at the [place] [facility] [place]. He asked that it be sent there to the [facility] medical center in [place] electronically or a hard written Rx, so he can take it there. I told him to discuss with you at the visit unless he needed it before, which he indicated he would not. He states he is good for now, but just wanted [place] to be aware. I told him I would relay to Dr. Ophelia Charter and Tamela Oddi regarding the request. Thank you!

## 2019-03-25 ENCOUNTER — Inpatient Hospital Stay: Payer: Non-veteran care | Admitting: Orthopaedic Surgery

## 2019-03-26 ENCOUNTER — Other Ambulatory Visit: Payer: Self-pay

## 2019-03-26 ENCOUNTER — Encounter: Payer: Self-pay | Admitting: Orthopaedic Surgery

## 2019-03-26 ENCOUNTER — Ambulatory Visit (INDEPENDENT_AMBULATORY_CARE_PROVIDER_SITE_OTHER): Payer: Non-veteran care | Admitting: Orthopaedic Surgery

## 2019-03-26 DIAGNOSIS — Z96652 Presence of left artificial knee joint: Secondary | ICD-10-CM | POA: Insufficient documentation

## 2019-03-26 MED ORDER — OXYCODONE-ACETAMINOPHEN 5-325 MG PO TABS: 1.0000 | ORAL_TABLET | Freq: Three times a day (TID) | ORAL | 0 refills | Status: DC | PRN

## 2019-03-26 NOTE — Progress Notes (Signed)
   Post-Op Visit Note   Patient: Patrick Bright           Date of Birth: 30-May-1955           MRN: [id number] Visit Date: 03/26/2019 PCP: Patient, No Pcp Per   Assessment & Plan:  Chief Complaint:  Chief Complaint  Patient presents with  . Left Knee - Follow-up    03/07/2019 Left TKA   Visit Diagnoses:  1. S/P total knee arthroplasty, left     Plan: He has been taking 1 Percocet 5 times a day and is cut back on his pain medicine just takes it when he do those his exercises.  He lacks 6 degrees reaching full extension we discussed prone positioning using his ruck sack that he has at home that he can put 1 or 2 bags of 5 pound sugar in.  He needs to work on his strength extension.  Flexion is to 102 he will continue to work on strengthening he is walking without a cane.  Recheck 2 weeks.  I discussed the importance of making sure he gets full extension.  Strength is doing well.  Follow-Up Instructions: Return in about 2 weeks (around 04/09/2019).   Orders:  No orders of the defined types were placed in this encounter.  Meds ordered this encounter  Medications  . oxyCODONE-acetaminophen (PERCOCET/ROXICET) 5-325 MG tablet    Sig: Take 1 tablet by mouth every 8 (eight) hours as needed for severe pain. Post op pain    Dispense:  20 tablet    Refill:  0  . oxyCODONE-acetaminophen (PERCOCET/ROXICET) 5-325 MG tablet    Sig: Take 1 tablet by mouth every 8 (eight) hours as needed for severe pain.    Dispense:  20 tablet    Refill:  0    Imaging: No results found.  PMFS History: Patient Active Problem List   Diagnosis Date Noted  . S/P total knee arthroplasty, left 03/26/2019   Past Medical History:  Diagnosis Date  . Arthritis   . Gout     Family History  Problem Relation Age of Onset  . Cancer Father     Past Surgical History:  Procedure Laterality Date  . ANKLE SURGERY Left 05/03/2009   tarsal tunnel syndrome   . KNEE ARTHROSCOPY Right 2017  . KNEE ARTHROSCOPY Left  2018  . TOTAL KNEE ARTHROPLASTY Left 03/07/2019  . TOTAL KNEE ARTHROPLASTY Left 03/07/2019   Procedure: LEFT TOTAL KNEE ARTHROPLASTY CEMENTED;  Surgeon: Eldred Manges, MD;  Location: [facility];  Service: Orthopedics;  Laterality: Left;  . UMBILICAL HERNIA REPAIR  04/26/2018   Social History   Occupational History  . Not on file  Tobacco Use  . Smoking status: Former Games developer  . Smokeless tobacco: Never Used  . Tobacco comment: quit 10/2018  Substance and Sexual Activity  . Alcohol use: Yes    Alcohol/week: 2.0 standard drinks    Types: 2 Cans of beer per week    Comment: occasional  . Drug use: Never  . Sexual activity: Not on file

## 2019-04-09 ENCOUNTER — Ambulatory Visit (INDEPENDENT_AMBULATORY_CARE_PROVIDER_SITE_OTHER): Payer: Non-veteran care | Admitting: Orthopaedic Surgery

## 2019-04-09 ENCOUNTER — Encounter: Payer: Self-pay | Admitting: Orthopaedic Surgery

## 2019-04-09 ENCOUNTER — Other Ambulatory Visit: Payer: Self-pay

## 2019-04-09 VITALS — BP 141/88 | HR 85 | Ht 71.0 in | Wt 215.0 lb

## 2019-04-09 DIAGNOSIS — Z96652 Presence of left artificial knee joint: Secondary | ICD-10-CM

## 2019-04-09 NOTE — Progress Notes (Signed)
   Post-Op Visit Note   Patient: Patrick Bright           Date of Birth: 01/24/1956           MRN: [id number] Visit Date: 04/09/2019 PCP: Patient, No Pcp Per   Assessment & Plan: Post left total knee arthroplasty.  He lacks 3 degrees reaching full extension still working on prone positioning.  Intermittent use of a knee sleeve.  He is happy with the surgical result flexion is good quad strength good and is walking without a cane.  Chief Complaint:  Chief Complaint  Patient presents with  . Left Knee - Follow-up    03/07/2019 Left TKA Cemented   Visit Diagnoses: Post left total knee arthroplasty making good progress recheck 1 month.  He just needs the last few degrees of extension and he will have full function of his knee returned.   Plan: Continue therapy return in 1 month.  Follow-Up Instructions: Return in about 1 month (around 05/10/2019).   Orders:  No orders of the defined types were placed in this encounter.  No orders of the defined types were placed in this encounter.   Imaging: No results found.  PMFS History: Patient Active Problem List   Diagnosis Date Noted  . S/P total knee arthroplasty, left 03/26/2019   Past Medical History:  Diagnosis Date  . Arthritis   . Gout     Family History  Problem Relation Age of Onset  . Cancer Father     Past Surgical History:  Procedure Laterality Date  . ANKLE SURGERY Left 05/03/2009   tarsal tunnel syndrome   . KNEE ARTHROSCOPY Right 2017  . KNEE ARTHROSCOPY Left 2018  . TOTAL KNEE ARTHROPLASTY Left 03/07/2019  . TOTAL KNEE ARTHROPLASTY Left 03/07/2019   Procedure: LEFT TOTAL KNEE ARTHROPLASTY CEMENTED;  Surgeon: Eldred Manges, MD;  Location: [facility];  Service: Orthopedics;  Laterality: Left;  . UMBILICAL HERNIA REPAIR  04/26/2018   Social History   Occupational History  . Not on file  Tobacco Use  . Smoking status: Former Games developer  . Smokeless tobacco: Never Used  . Tobacco comment: quit 10/2018  Substance and  Sexual Activity  . Alcohol use: Yes    Alcohol/week: 2.0 standard drinks    Types: 2 Cans of beer per week    Comment: occasional  . Drug use: Never  . Sexual activity: Not on file

## 2019-05-08 ENCOUNTER — Ambulatory Visit: Payer: Non-veteran care | Admitting: Gastroenterology

## 2019-05-14 ENCOUNTER — Encounter: Payer: Self-pay | Admitting: Orthopaedic Surgery

## 2019-05-14 ENCOUNTER — Other Ambulatory Visit: Payer: Self-pay

## 2019-05-14 ENCOUNTER — Ambulatory Visit (INDEPENDENT_AMBULATORY_CARE_PROVIDER_SITE_OTHER): Payer: No Typology Code available for payment source | Admitting: Orthopaedic Surgery

## 2019-05-14 VITALS — Ht 71.0 in | Wt 215.0 lb

## 2019-05-14 DIAGNOSIS — Z96652 Presence of left artificial knee joint: Secondary | ICD-10-CM

## 2019-05-14 NOTE — Progress Notes (Signed)
Post left total knee arthroplasty.  He did his 15 physical therapy visits he has full extension and has flexion past 125 degrees he is amatory without a limp.  After he does therapy sometimes he has some soreness in his knee.  He can get from sitting to standing without using his hands.  He has a few more therapy visits but I discussed with him at this point he can do his workout on his own.  His principal goal is to resume golf and I think he can go out start chipping and putting and then progress to playing if it does not bother him.  Opposite knee gives him only moderate symptoms and he can follow-up with his right knee on an as-needed basis he is very happy the surgical result.

## 2019-05-28 ENCOUNTER — Other Ambulatory Visit: Payer: Self-pay

## 2019-05-28 ENCOUNTER — Encounter: Payer: Self-pay | Admitting: Gastroenterology

## 2019-05-28 ENCOUNTER — Ambulatory Visit (INDEPENDENT_AMBULATORY_CARE_PROVIDER_SITE_OTHER): Payer: No Typology Code available for payment source | Admitting: Gastroenterology

## 2019-05-28 VITALS — BP 122/80 | HR 97 | Temp 97.5°F | Ht 71.0 in | Wt 216.4 lb

## 2019-05-28 DIAGNOSIS — R945 Abnormal results of liver function studies: Secondary | ICD-10-CM | POA: Diagnosis not present

## 2019-05-28 DIAGNOSIS — R9389 Abnormal findings on diagnostic imaging of other specified body structures: Secondary | ICD-10-CM | POA: Diagnosis not present

## 2019-05-28 DIAGNOSIS — R7989 Other specified abnormal findings of blood chemistry: Secondary | ICD-10-CM

## 2019-05-28 NOTE — Patient Instructions (Signed)
If you are age 63 or older, your body mass index should be between 23-30. Your Body mass index is 30.18 kg/m. If this is out of the aforementioned range listed, please consider follow up with your Primary Care Provider.  If you are age 30 or younger, your body mass index should be between 19-25. Your Body mass index is 30.18 kg/m. If this is out of the aformentioned range listed, please consider follow up with your Primary Care Provider.   Please go to the lab at [facility] ([place] You will need to go to level "B", you do not need an appointment for this. Hours available are 7:30 am - 4:30 pm.   .You have been scheduled for a CT [facility] of the abdomen and pelvis [facility] [place] [address] [address] [address] [facility]   You are scheduled on 06/04/19 at [place] should arrive 15 minutes prior to your appointment time for registration. Please follow the written instructions below on the day of your exam:  WARNING: IF YOU ARE ALLERGIC TO IODINE/X-RAY DYE, PLEASE NOTIFY RADIOLOGY IMMEDIATELY AT [phone]! YOU WILL BE GIVEN A 13 HOUR PREMEDICATION PREP.  1) Do not eat or drink anything after 4am (4 hours prior to your test) 2) You have been given 2 bottles of oral contrast to drink. The solution may taste better if refrigerated, but do NOT add ice or any other liquid to this solution. Shake well before drinking.    Drink 1 bottle of contrast @ 6am (2 hours prior to your exam)  Drink 1 bottle of contrast @ 7am (1 hour prior to your exam)  You may take any medications as prescribed with a small amount of water, if necessary. If you take any of the following medications: METFORMIN, GLUCOPHAGE, GLUCOVANCE, AVANDAMET, RIOMET, FORTAMET, [place] MET, JANUMET, GLUMETZA or METAGLIP, you MAY be asked to HOLD this medication 48 hours AFTER the exam.  The purpose of you drinking the oral contrast is to aid in the visualization of your intestinal  tract. The contrast solution may cause some diarrhea. Depending on your individual set of symptoms, you may also receive an intravenous injection of x-ray contrast/dye. Plan on being at [facility] for 30 minutes or longer, depending on the type of exam you are having performed.  This test typically takes 30-45 minutes to complete.  If you have any questions regarding your exam or if you need to reschedule, you may call the CT department at [phone] between the hours of 8:00 am and 5:00 pm, Monday-Friday.  ________________________________________________________________________  Dennis Bast have been scheduled for an abdominal ultrasound with elastography at [facility] (1st floor). Your appointment is scheduled for 06/06/19 at [place]. Please arrive 15 minutes prior to your scheduled appointment for registration purposes. Make certain not to have anything to eat or drink 6 hours prior to your procedure. Should you need to reschedule your appointment, you may contact radiology at [phone].  Liver Elastography Various chronic liver diseases such as hepatitis B, C, and fatty liver disease can lead to tissue damage and subsequent scar tissue formation. As the scar tissue accumulates, the liver loses some of its elasticity and becomes stiffer. Liver elastography involves the use of a surface ultrasound probe that delivers a low frequency pulse or shear wave to a small volume of liver tissue under the rib cage. The transmission of the sound wave is completely painless. How Is a Liver Elastography Performed? The liver is  located in the right upper abdomen under the rib cage. Patients are asked to lie flat on an examination table. A technician places the FibroScan probe between the ribs on the right side of the lower chest wall. A series of 10 painless pulses are then applied to the liver. The results are recorded on the equipment and an overall liver stiffness score is generated. This score is  then interpreted by a qualified physician to predict the likelihood of advanced fibrosis or cirrhosis.  Patients are asked to wear loose clothing and should not consume any liquids or solids for a minimum of 4 hours before the test to increase the likelihood of obtaining reliable test results. The scan will take 10 to 15 minutes to complete, but patients should plan on being available for 30 minutes to allow time for preparation  Lose 6 pounds in the next 12 weeks.   Thank you,  Dr. Jackquline Denmark

## 2019-05-28 NOTE — Progress Notes (Signed)
Chief Complaint:   Referring Provider:  No ref. provider found      ASSESSMENT AND PLAN;   #1. Abn LFTs likely d/t fatty liver.  No liver cirrhosis on MRI 02/25/2019.  #2. Abn MRI 02/25/2019 @ [facility] showing borderline enlarged mesenteric LNs.  Advised to get CECT in 3 months.  Report sent for scanning.  #3. FH colon cancer. Last colon 03/2017 @[facility] : neg. Rpt in 52yrs per pt.  Plan: -Check CBC, CMP, acute viral hepatitis, iron studies, celiac screen and GGT. -Check anti-HAV total Ab and HBsAb.  If negative, would recommend vaccination for hepatitis A and B. -Proceed with hepatic elastography. -CT AP with PO and IV contrast as suggested by radiology (see MRI report). -Weight loss  -Weight loss  -Weight loss.  Aim is to reduce 6lb over 12 weeks. -Stop all alcohol. -FU in 12 weeks. -I have discussed findings of MRI Abdo with the patient in detail.    HPI:    Patrick Bright is a 64 y.o. male  [facility]erred by [facility] For abnormal LFTs. 68 and then CT showing fatty liver.  LFTs as below.  No nausea, vomiting, heartburn, regurgitation, odynophagia or dysphagia.  No significant diarrhea or constipation.  No melena or hematochezia. No unintentional weight loss. No abdominal pain.  Had gained weight after left knee replacement December 2020.  No history of itching, skin lesions, easy bruisability, intake of over-the-counter medications including diet pills, herbal medications, anabolic steroids or Tylenol.  There is no history of blood transfusions, IV drug use or family history of liver disease.  No jaundice dark urine or pale stools.  No history of alcohol abuse.  He does drink beer occasionally.  MRI abdomen with and without contrast 02/25/2019.  Compared with renal ultrasound 12/28/2018 and chest CT 11/30/2018: -Due to severe diffuse hepatic steatosis.  No overt cirrhosis by MRI.  Consider hepatic elastography. -Benign hemorrhagic cyst in the upper right kidney. -Indeterminate borderline right  mesenteric lymph nodes.  Recommend CEA CT Abdo/pelvis in 3 months. -Normal gallbladder, pancreas, spleen and adrenal glands.   Past Medical History:  Diagnosis Date  . Arthritis   . Gout     Past Surgical History:  Procedure Laterality Date  . ANKLE SURGERY Left 05/03/2009   tarsal tunnel syndrome   . COLONOSCOPY  12/09/2012   Minimal sigmoid diverticulosis. Small internal hemorrhoids. Otherwise normal colonoscopy to the cecum  . COLONOSCOPY  03/2017   [facility]   . KNEE ARTHROSCOPY Right 2017  . KNEE ARTHROSCOPY Left 2018  . TOTAL KNEE ARTHROPLASTY Left 03/07/2019  . TOTAL KNEE ARTHROPLASTY Left 03/07/2019   Procedure: LEFT TOTAL KNEE ARTHROPLASTY CEMENTED;  Surgeon: 03/09/2019, MD;  Location: [facility] OR;  Service: Orthopedics;  Laterality: Left;  . UMBILICAL HERNIA REPAIR  04/26/2018  . VASECTOMY      Family History  Problem Relation Age of Onset  . Colon cancer Father   . Prostate cancer Father   . Esophageal cancer Neg Hx     Social History   Tobacco Use  . Smoking status: Former 06/25/2018  . Smokeless tobacco: Never Used  . Tobacco comment: quit 10/2018  Substance Use Topics  . Alcohol use: Yes    Alcohol/week: 2.0 standard drinks    Types: 2 Cans of beer per week    Comment: occasional  . Drug use: Never    Current Outpatient Medications  Medication Sig Dispense Refill  . allopurinol (ZYLOPRIM) 100 MG tablet Take 200 mg by mouth daily.     11/2018  diclofenac (VOLTAREN) 75 MG EC tablet Take 75 mg by mouth 2 (two) times daily as needed.    . nicotine polacrilex (NICORETTE) 2 MG gum Take 2 mg by mouth as needed for smoking cessation.    Marland Kitchen NAPROXEN PO Take 2 tablets by mouth daily as needed.    . traZODone (DESYREL) 50 MG tablet Take 50 mg by mouth at bedtime as needed for sleep.      No current facility-administered medications for this visit.    No Known Allergies  Review of Systems:  Constitutional: Denies fever, chills, diaphoresis, appetite change and fatigue.   HEENT: Denies photophobia, eye pain, redness, ear pain, congestion, sore throat, rhinorrhea, sneezing, mouth sores, neck pain, neck stiffness and tinnitus.  Has hearing problems. Respiratory: Denies SOB, DOE, cough, chest tightness,  and wheezing.   Cardiovascular: Denies chest pain, palpitations and leg swelling.  Genitourinary: Denies dysuria, urgency, frequency, hematuria, flank pain and difficulty urinating.  Musculoskeletal: Denies myalgias, has back pain. No joint swelling, arthralgias and gait problem.  Left knee replacement 02/2019. Skin: No rash.  Neurological: Denies dizziness, seizures, syncope, weakness, light-headedness, numbness and headaches.  Hematological: Denies adenopathy. Easy bruising, personal or family bleeding history  Psychiatric/Behavioral: Has anxiety or depression.  Has sleeping problems.     Physical Exam:    BP 122/80   Pulse 97   Temp (!) 97.5 F (36.4 C)   Ht 5\' 11"  (1.803 m)   Wt 216 lb 6 oz (98.1 kg)   BMI 30.18 kg/m  Wt Readings from Last 3 Encounters:  05/28/19 216 lb 6 oz (98.1 kg)  05/14/19 215 lb (97.5 kg)  04/09/19 215 lb (97.5 kg)   Constitutional:  Well-developed, in no acute distress. Psychiatric: Normal mood and affect. Behavior is normal. HEENT: Pupils normal.  Conjunctivae are normal. No scleral icterus. Neck supple.  Cardiovascular: Normal rate, regular rhythm. No edema Pulmonary/chest: Effort normal and breath sounds normal. No wheezing, rales or rhonchi. Abdominal: Soft, nondistended. Nontender. Bowel sounds active throughout. There are no masses palpable. No hepatomegaly. Rectal:  defered Neurological: Alert and oriented to person place and time. Skin: Skin is warm and dry. No rashes noted.  Data Reviewed: I have personally reviewed following labs and imaging studies  CBC: CBC Latest Ref Rng & Units 03/08/2019 03/04/2019  WBC 4.0 - 10.5 K/uL 12.5(H) 7.9  Hemoglobin 13.0 - 17.0 g/dL 11.7(L) 15.7  Hematocrit 39.0 - 52.0 %  34.1(L) 47.0  Platelets 150 - 400 K/uL 229 271    CMP: CMP Latest Ref Rng & Units 03/08/2019 03/04/2019  Glucose 70 - 99 mg/dL 137(H) 95  BUN 8 - 23 mg/dL 11 12  Creatinine 0.61 - 1.24 mg/dL 0.91 0.98  Sodium 135 - 145 mmol/L 134(L) 139  Potassium 3.5 - 5.1 mmol/L 4.0 4.2  Chloride 98 - 111 mmol/L 101 103  CO2 22 - 32 mmol/L 25 19(L)  Calcium 8.9 - 10.3 mg/dL 8.4(L) 9.6  Total Protein 6.5 - 8.1 g/dL - 7.5  Total Bilirubin 0.3 - 1.2 mg/dL - 0.7  Alkaline Phos 38 - 126 U/L - 57  AST 15 - 41 U/L - 45(H)  ALT 0 - 44 U/L - 62(H)       Carmell Austria, MD 05/28/2019, 10:28 AM  Cc: No ref. provider found

## 2019-05-30 ENCOUNTER — Other Ambulatory Visit (INDEPENDENT_AMBULATORY_CARE_PROVIDER_SITE_OTHER): Payer: No Typology Code available for payment source

## 2019-05-30 DIAGNOSIS — R945 Abnormal results of liver function studies: Secondary | ICD-10-CM | POA: Diagnosis not present

## 2019-05-30 DIAGNOSIS — R9389 Abnormal findings on diagnostic imaging of other specified body structures: Secondary | ICD-10-CM

## 2019-05-30 DIAGNOSIS — R7989 Other specified abnormal findings of blood chemistry: Secondary | ICD-10-CM

## 2019-05-30 LAB — COMPREHENSIVE METABOLIC PANEL
ALT: 24 U/L (ref 0–53)
AST: 18 U/L (ref 0–37)
Albumin: 4 g/dL (ref 3.5–5.2)
Alkaline Phosphatase: 55 U/L (ref 39–117)
BUN: 16 mg/dL (ref 6–23)
CO2: 23 mEq/L (ref 19–32)
Calcium: 9.4 mg/dL (ref 8.4–10.5)
Chloride: 103 mEq/L (ref 96–112)
Creatinine, Ser: 0.92 mg/dL (ref 0.40–1.50)
GFR: 83.24 mL/min (ref >60.00)
Glucose, Bld: 148 mg/dL — ABNORMAL HIGH (ref 70–99)
Potassium: 3.8 mEq/L (ref 3.5–5.1)
Sodium: 135 mEq/L (ref 135–145)
Total Bilirubin: 0.6 mg/dL (ref 0.2–1.2)
Total Protein: 7.3 g/dL (ref 6.0–8.3)

## 2019-05-30 LAB — CBC WITH DIFFERENTIAL/PLATELET
Basophils Absolute: 0.1 10*3/uL (ref 0.0–0.1)
Basophils Relative: 0.8 % (ref 0.0–3.0)
Eosinophils Absolute: 0.1 10*3/uL (ref 0.0–0.7)
Eosinophils Relative: 1.9 % (ref 0.0–5.0)
HCT: 46.4 % (ref 39.0–52.0)
Hemoglobin: 15.8 g/dL (ref 13.0–17.0)
Lymphocytes Relative: 24.6 % (ref 12.0–46.0)
Lymphs Abs: 1.7 10*3/uL (ref 0.7–4.0)
MCHC: 34 g/dL (ref 30.0–36.0)
MCV: 92.3 fl (ref 78.0–100.0)
Monocytes Absolute: 0.7 10*3/uL (ref 0.1–1.0)
Monocytes Relative: 10.2 % (ref 3.0–12.0)
Neutro Abs: 4.3 10*3/uL (ref 1.4–7.7)
Neutrophils Relative %: 62.5 % (ref 43.0–77.0)
Platelets: 235 10*3/uL (ref 150.0–400.0)
RBC: 5.03 Mil/uL (ref 4.22–5.81)
RDW: 14.2 % (ref 11.5–15.5)
WBC: 6.8 10*3/uL (ref 4.0–10.5)

## 2019-05-30 LAB — GAMMA GT: GGT: 18 U/L (ref 7–51)

## 2019-06-02 ENCOUNTER — Other Ambulatory Visit: Payer: Self-pay

## 2019-06-02 DIAGNOSIS — R945 Abnormal results of liver function studies: Secondary | ICD-10-CM

## 2019-06-02 DIAGNOSIS — R7989 Other specified abnormal findings of blood chemistry: Secondary | ICD-10-CM

## 2019-06-02 LAB — IRON, TOTAL/TOTAL IRON BINDING CAP
%SAT: 54 % (calc) — ABNORMAL HIGH (ref 20–48)
Iron: 193 ug/dL — ABNORMAL HIGH (ref 50–180)
TIBC: 359 mcg/dL (calc) (ref 250–425)

## 2019-06-02 LAB — HEPATITIS PANEL, ACUTE
Hep A IgM: NONREACTIVE
Hep B C IgM: NONREACTIVE
Hepatitis B Surface Ag: NONREACTIVE
Hepatitis C Ab: NONREACTIVE
SIGNAL TO CUT-OFF: 0.02 (ref <1.00)

## 2019-06-02 LAB — HEPATITIS A ANTIBODY, TOTAL: Hepatitis A AB,Total: REACTIVE — AB

## 2019-06-02 LAB — HEPATITIS B SURFACE ANTIBODY,QUALITATIVE: Hep B S Ab: REACTIVE — AB

## 2019-06-03 ENCOUNTER — Other Ambulatory Visit: Payer: Non-veteran care

## 2019-06-03 DIAGNOSIS — R945 Abnormal results of liver function studies: Secondary | ICD-10-CM

## 2019-06-03 DIAGNOSIS — R7989 Other specified abnormal findings of blood chemistry: Secondary | ICD-10-CM

## 2019-06-03 NOTE — Addendum Note (Signed)
Addended by: Johnney Killian on: 06/03/2019 10:11 AM   Modules accepted: Orders

## 2019-06-04 ENCOUNTER — Other Ambulatory Visit: Payer: Self-pay

## 2019-06-04 ENCOUNTER — Encounter (HOSPITAL_BASED_OUTPATIENT_CLINIC_OR_DEPARTMENT_OTHER): Payer: Self-pay

## 2019-06-04 ENCOUNTER — Ambulatory Visit (HOSPITAL_BASED_OUTPATIENT_CLINIC_OR_DEPARTMENT_OTHER)
Admission: RE | Admit: 2019-06-04 | Discharge: 2019-06-04 | Disposition: A | Discharge status: Home / Self Care | Payer: No Typology Code available for payment source | Source: Ambulatory Visit | Attending: Gastroenterology | Admitting: Gastroenterology

## 2019-06-04 DIAGNOSIS — R9389 Abnormal findings on diagnostic imaging of other specified body structures: Secondary | ICD-10-CM | POA: Insufficient documentation

## 2019-06-04 DIAGNOSIS — R945 Abnormal results of liver function studies: Secondary | ICD-10-CM | POA: Insufficient documentation

## 2019-06-04 DIAGNOSIS — R7989 Other specified abnormal findings of blood chemistry: Secondary | ICD-10-CM

## 2019-06-04 LAB — CELIAC PANEL 10
Antigliadin Abs, IgA: 6 units (ref 0–19)
Endomysial IgA: NEGATIVE
Gliadin IgG: 2 units (ref 0–19)
IgA/Immunoglobulin A, Serum: 422 mg/dL (ref 61–437)
Tissue Transglut Ab: <2 U/mL (ref 0–5)
Transglutaminase IgA: <2 U/mL (ref 0–3)

## 2019-06-04 MED ORDER — IOHEXOL 300 MG/ML  SOLN
100.0000 mL | Freq: Once | INTRAMUSCULAR | Status: AC | PRN
  Administered 2019-06-04: 08:00:00 100 mL via INTRAVENOUS

## 2019-06-06 ENCOUNTER — Ambulatory Visit (HOSPITAL_COMMUNITY): Payer: Non-veteran care

## 2019-06-09 ENCOUNTER — Other Ambulatory Visit: Payer: Self-pay

## 2019-06-09 ENCOUNTER — Ambulatory Visit (HOSPITAL_COMMUNITY)
Admission: RE | Admit: 2019-06-09 | Discharge: 2019-06-09 | Disposition: A | Discharge status: Home / Self Care | Payer: No Typology Code available for payment source | Source: Ambulatory Visit | Attending: Gastroenterology | Admitting: Gastroenterology

## 2019-06-09 DIAGNOSIS — R945 Abnormal results of liver function studies: Secondary | ICD-10-CM | POA: Insufficient documentation

## 2019-06-09 DIAGNOSIS — R7989 Other specified abnormal findings of blood chemistry: Secondary | ICD-10-CM

## 2019-06-09 DIAGNOSIS — R9389 Abnormal findings on diagnostic imaging of other specified body structures: Secondary | ICD-10-CM | POA: Insufficient documentation

## 2019-06-12 LAB — HEMOCHROMATOSIS DNA-PCR(C282Y,H63D)

## 2021-05-17 IMAGING — CT CT ABD-PELV W/ CM
2 of 5 series · 16 of 46 positions shown, 18 images · IV contrast (omnipaque)
Comparison: None.

CLINICAL DATA: Abnormal LFTs, follow-up fatty liver on MRI seen at
outside hospital

EXAM:
CT ABDOMEN AND PELVIS WITH CONTRAST
TECHNIQUE: Multidetector CT imaging of the abdomen and pelvis was performed
using the standard protocol following bolus administration of
intravenous contrast.
CONTRAST:  100mL OMNIPAQUE IOHEXOL 300 MG/ML SOLN, additional oral
enteric contrast

[Series 2: axial st · axial · 0.94mm/px · z∈[-494,-10]mm · 13 of 109 slices shown, 15 images]
[im 6/109  soft-tissue]
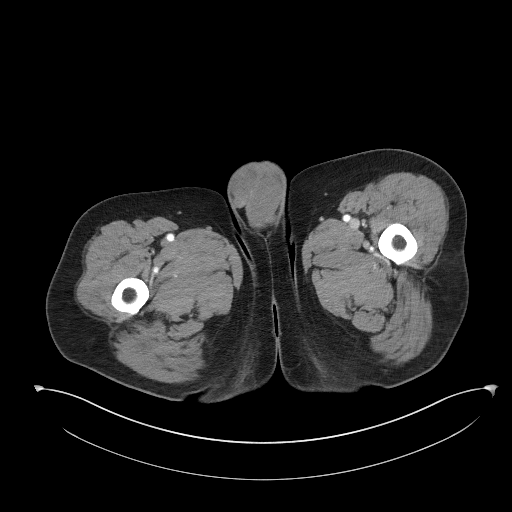
[im 6/109  bone]
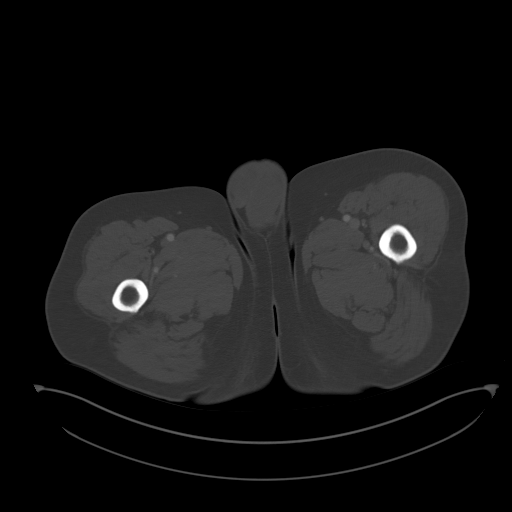
[im 17/109  soft-tissue]
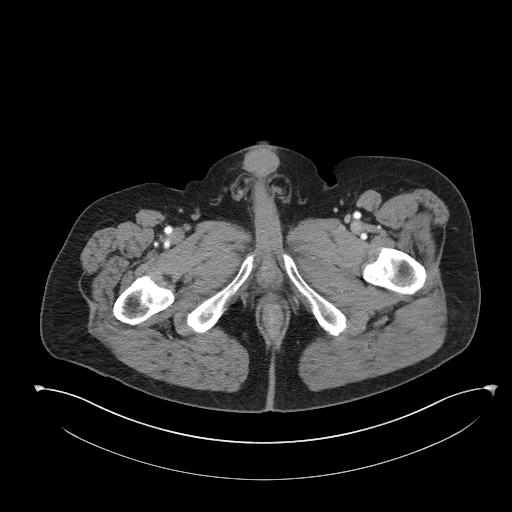
[im 22/109  soft-tissue]
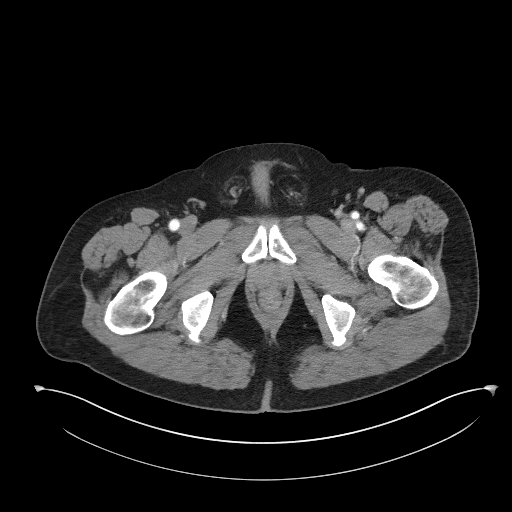
[im 33/109  soft-tissue]
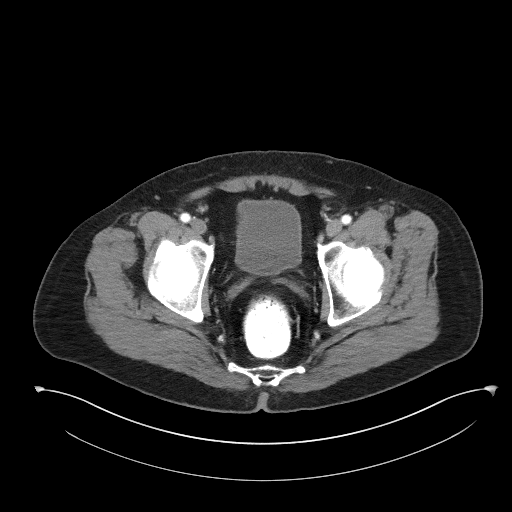
[im 38/109  soft-tissue]
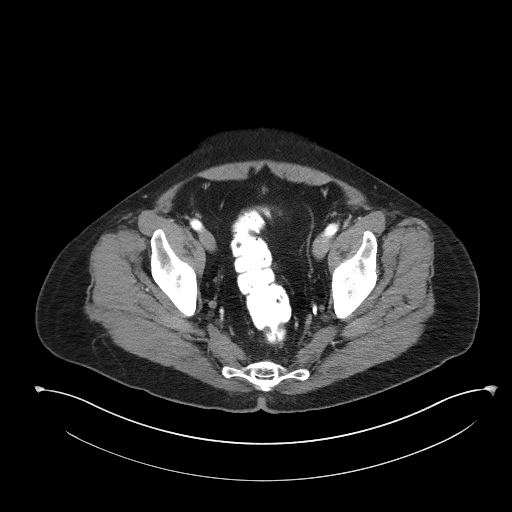
[im 49/109  soft-tissue]
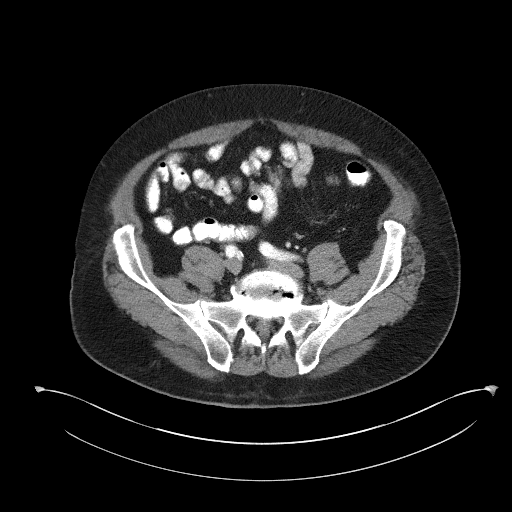
[im 55/109  soft-tissue]
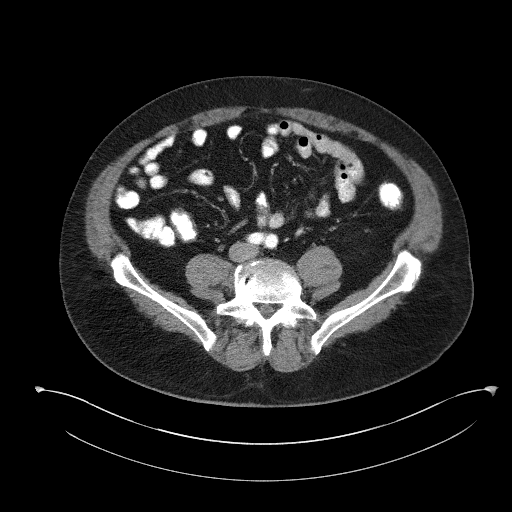
[im 60/109  soft-tissue]
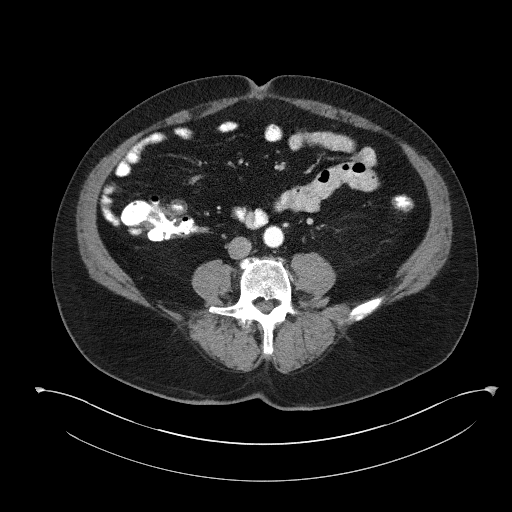
[im 71/109  soft-tissue]
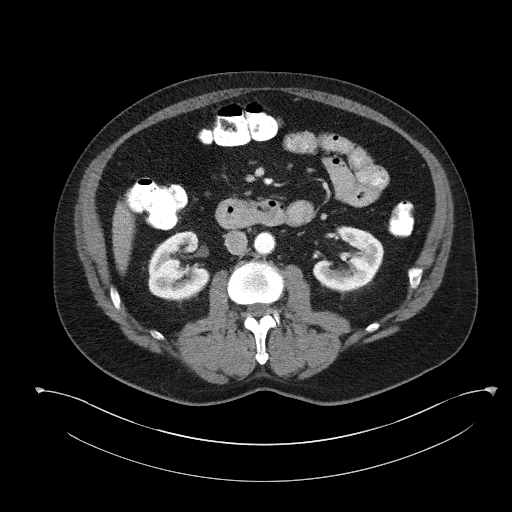
[im 71/109  bone]
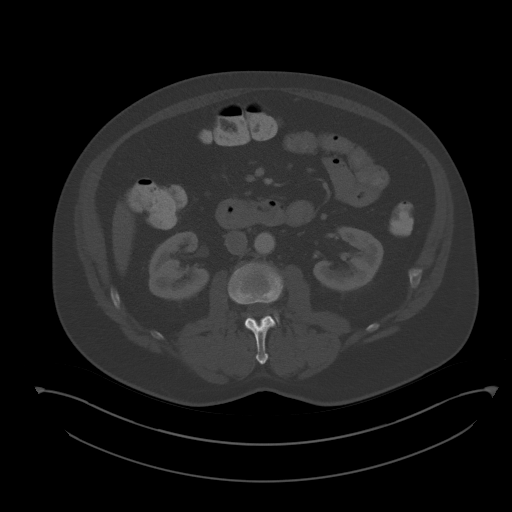
[im 76/109  soft-tissue]
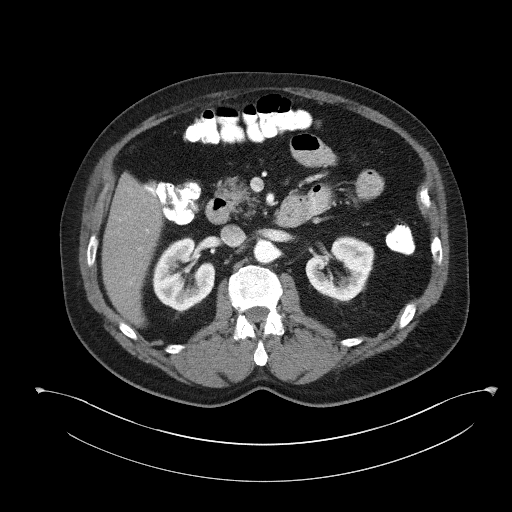
[im 87/109  soft-tissue]
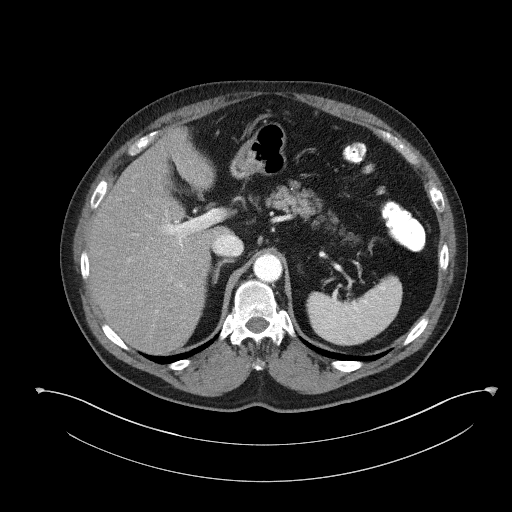
[im 92/109  soft-tissue]
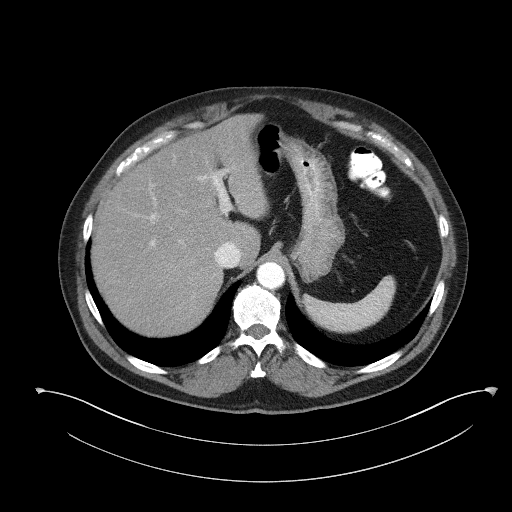
[im 103/109  soft-tissue]
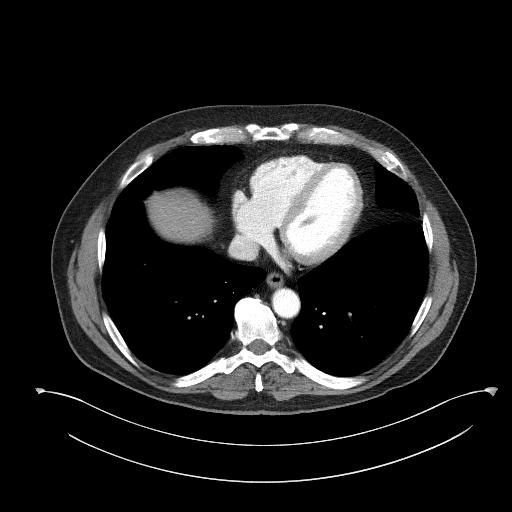

[Series 5: coronal st · coronal · 0.83mm/px · 3 of 108 slices shown]
[im 36/108  soft-tissue]
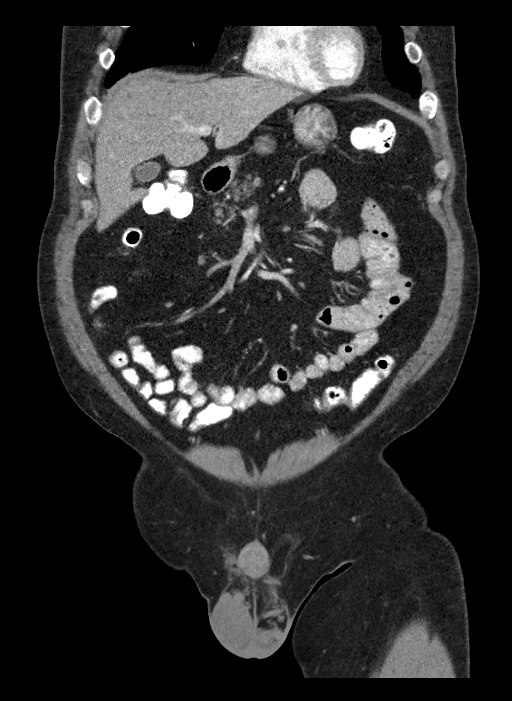
[im 48/108  soft-tissue]
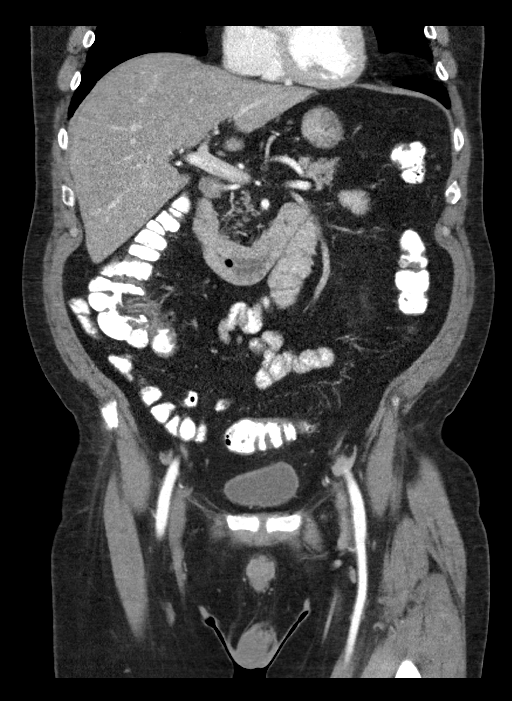
[im 60/108  soft-tissue]
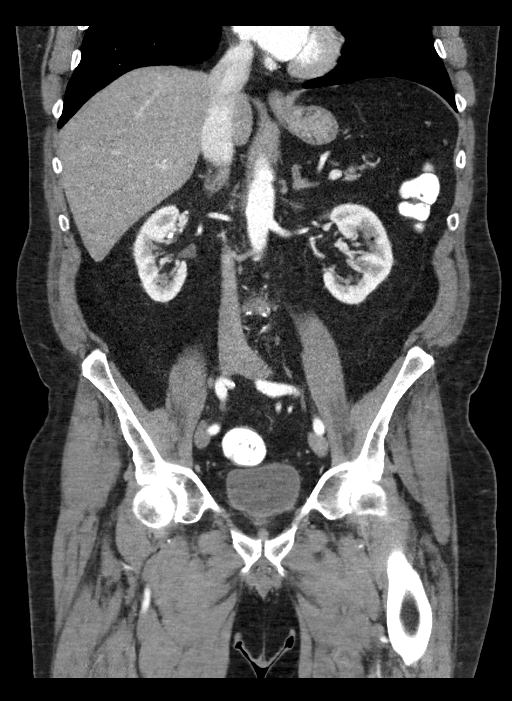

[16 of 46 positions shown; findings below may reference images not displayed]

FINDINGS: Lower chest: No acute abnormality.

Hepatobiliary: No solid liver abnormality is seen. Hepatic
steatosis. No gallstones, gallbladder wall thickening, or biliary
dilatation.

Pancreas: Unremarkable. No pancreatic ductal dilatation or
surrounding inflammatory changes.

Spleen: Normal in size without significant abnormality.

Adrenals/Urinary Tract: Adrenal glands are unremarkable.
Nonobstructive calculus of the anterior midportion of the right
kidney. No hydronephrosis. Bladder is unremarkable.

Stomach/Bowel: Stomach is within normal limits. Appendix appears
normal. No evidence of bowel wall thickening, distention, or
inflammatory changes. Sigmoid diverticula.

Vascular/Lymphatic: Minimal aortic atherosclerosis. No enlarged
abdominal or pelvic lymph nodes.

Reproductive: No mass or other significant abnormality.

Other: No abdominal wall hernia or abnormality. No abdominopelvic
ascites.

Musculoskeletal: No acute or significant osseous findings.
IMPRESSION: 1. Hepatic steatosis.
2. Nonobstructive right nephrolithiasis.
3. Sigmoid diverticulosis.
4. Minimal aortic atherosclerosis. Aortic Atherosclerosis
([id number]).

## 2022-06-29 DIAGNOSIS — Z01818 Encounter for other preprocedural examination: Secondary | ICD-10-CM | POA: Diagnosis not present
